# Patient Record
Sex: Female | Born: 1980 | Race: Black or African American | Hispanic: No | Smoking: Former smoker
Health system: Southern US, Community
[De-identification: ages and names within clinical notes are randomized; demographics above are authoritative.]

## PROBLEM LIST (undated history)

## (undated) DIAGNOSIS — Z87442 Personal history of urinary calculi: Secondary | ICD-10-CM

## (undated) HISTORY — PX: LITHOTRIPSY: SUR834

## (undated) HISTORY — PX: TONSILLECTOMY: SUR1361

## (undated) HISTORY — PX: CHOLECYSTECTOMY: SHX55

## (undated) HISTORY — PX: WISDOM TOOTH EXTRACTION: SHX21

## (undated) HISTORY — PX: OTHER SURGICAL HISTORY: SHX169

---

## 2003-01-16 ENCOUNTER — Encounter: Admission: RE | Admit: 2003-01-16 | Discharge: 2003-02-22 | Payer: Self-pay | Admitting: Unknown Physician Specialty

## 2003-03-28 ENCOUNTER — Encounter: Admission: RE | Admit: 2003-03-28 | Discharge: 2003-06-26 | Payer: Self-pay | Admitting: Orthopaedic Surgery

## 2006-09-01 ENCOUNTER — Other Ambulatory Visit: Admission: RE | Admit: 2006-09-01 | Discharge: 2006-09-01 | Payer: Self-pay | Admitting: Unknown Physician Specialty

## 2006-09-01 ENCOUNTER — Encounter (INDEPENDENT_AMBULATORY_CARE_PROVIDER_SITE_OTHER): Payer: Self-pay | Admitting: Specialist

## 2007-06-27 ENCOUNTER — Emergency Department (HOSPITAL_COMMUNITY): Admission: EM | Admit: 2007-06-27 | Discharge: 2007-06-27 | Payer: Self-pay | Admitting: Emergency Medicine

## 2008-06-29 ENCOUNTER — Emergency Department (HOSPITAL_COMMUNITY): Admission: EM | Admit: 2008-06-29 | Discharge: 2008-06-29 | Payer: Self-pay | Admitting: Emergency Medicine

## 2008-07-07 ENCOUNTER — Ambulatory Visit (HOSPITAL_COMMUNITY): Admission: RE | Admit: 2008-07-07 | Discharge: 2008-07-07 | Payer: Self-pay | Admitting: Urology

## 2008-07-10 ENCOUNTER — Emergency Department (HOSPITAL_COMMUNITY): Admission: EM | Admit: 2008-07-10 | Discharge: 2008-07-11 | Payer: Self-pay | Admitting: Emergency Medicine

## 2008-09-17 ENCOUNTER — Emergency Department (HOSPITAL_COMMUNITY): Admission: EM | Admit: 2008-09-17 | Discharge: 2008-09-17 | Payer: Self-pay | Admitting: Emergency Medicine

## 2009-03-20 ENCOUNTER — Emergency Department (HOSPITAL_COMMUNITY): Admission: EM | Admit: 2009-03-20 | Discharge: 2009-03-21 | Payer: Self-pay | Admitting: Pediatrics

## 2009-03-26 ENCOUNTER — Emergency Department (HOSPITAL_COMMUNITY): Admission: EM | Admit: 2009-03-26 | Discharge: 2009-03-26 | Payer: Self-pay | Admitting: Emergency Medicine

## 2010-05-26 ENCOUNTER — Emergency Department (HOSPITAL_COMMUNITY): Admission: EM | Admit: 2010-05-26 | Discharge: 2010-05-26 | Payer: Self-pay | Admitting: Emergency Medicine

## 2010-05-30 ENCOUNTER — Ambulatory Visit: Payer: Self-pay | Admitting: Otolaryngology

## 2010-06-09 ENCOUNTER — Emergency Department (HOSPITAL_COMMUNITY): Admission: EM | Admit: 2010-06-09 | Discharge: 2010-06-09 | Payer: Self-pay | Admitting: Emergency Medicine

## 2010-06-26 IMAGING — CT CT HEAD W/O CM
1 series · 16 of 30 positions shown, 20 images · non-contrast
Comparison: None

CLINICAL DATA: Severe headache for 2 days

CT HEAD WITHOUT CONTRAST
TECHNIQUE: Contiguous axial images were obtained from the base of
the skull through the vertex without contrast.

[Series 3: headseq 4.8 h37s · axial · 0.43mm/px · z∈[+92,+221]mm · 16 of 30 slices shown, 20 images]
[im 2/30  brain]
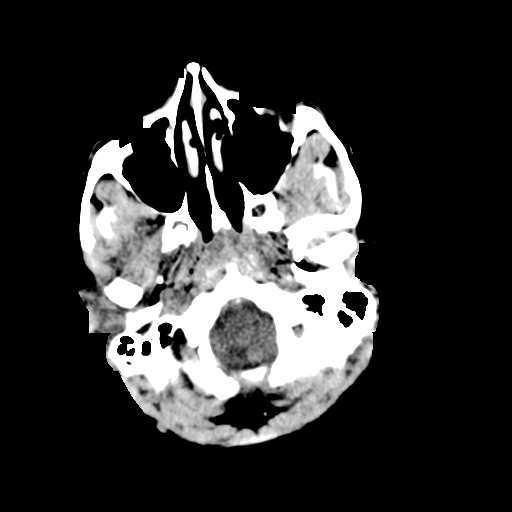
[im 2/30  bone]
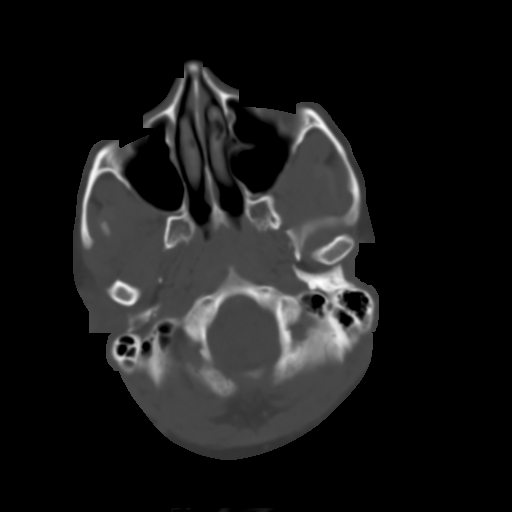
[im 4/30  brain]
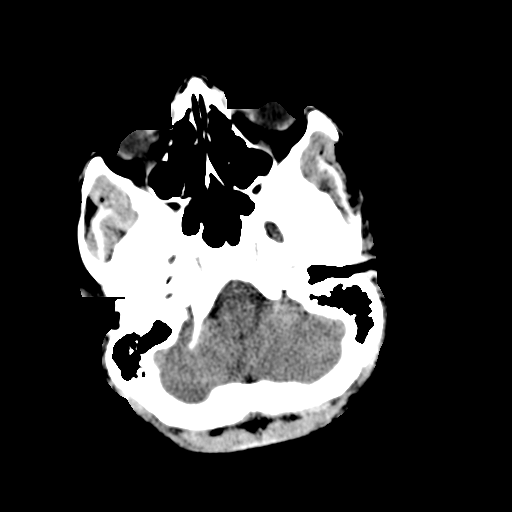
[im 6/30  brain]
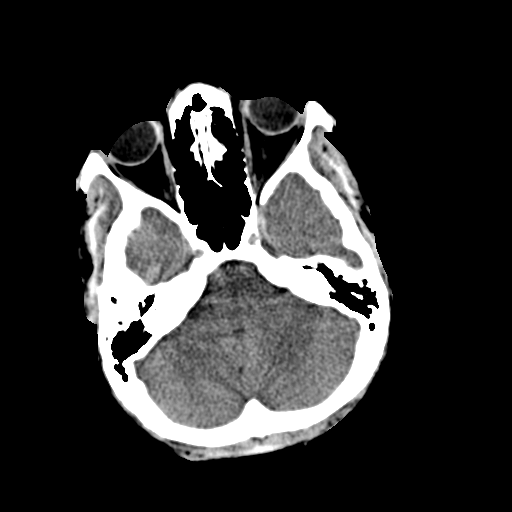
[im 8/30  brain]
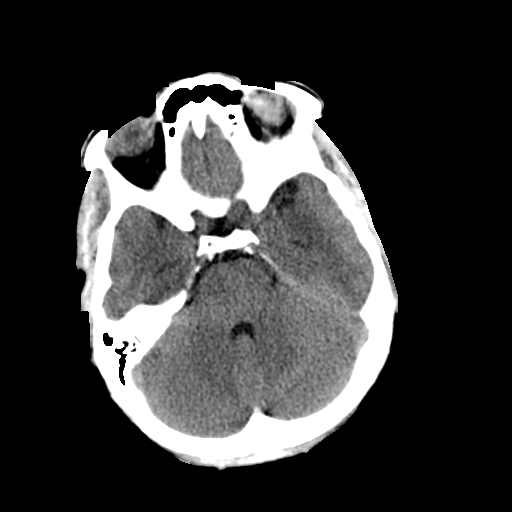
[im 9/30  brain]
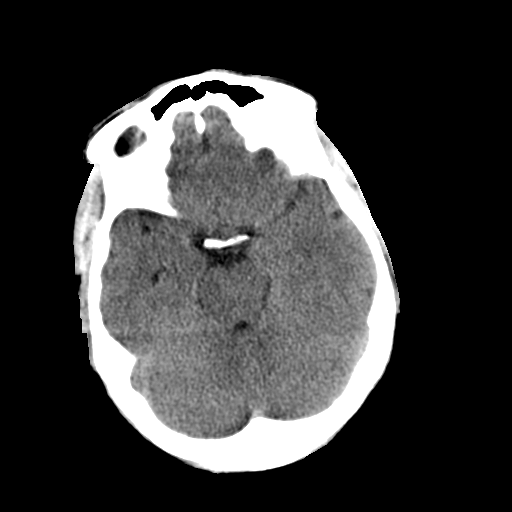
[im 9/30  bone]
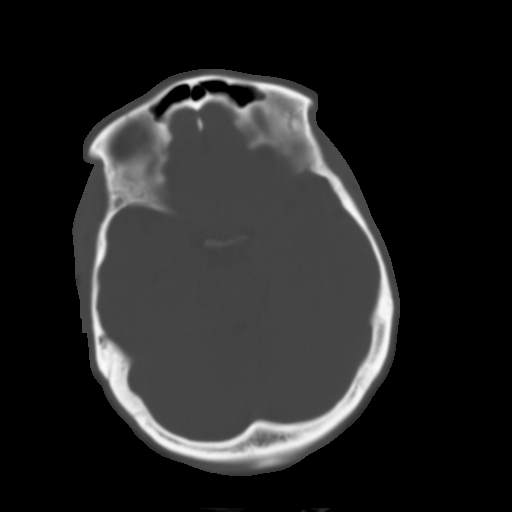
[im 11/30  brain]
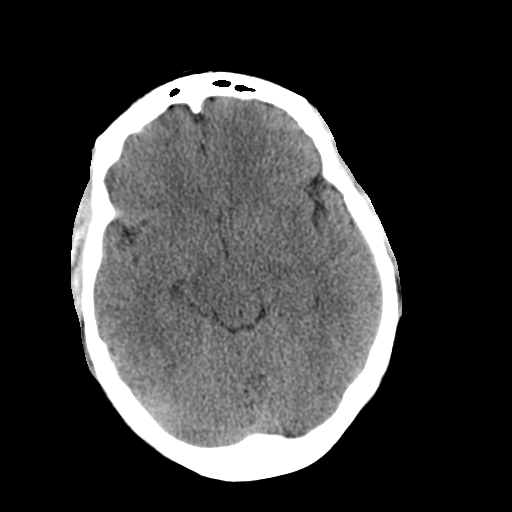
[im 13/30  brain]
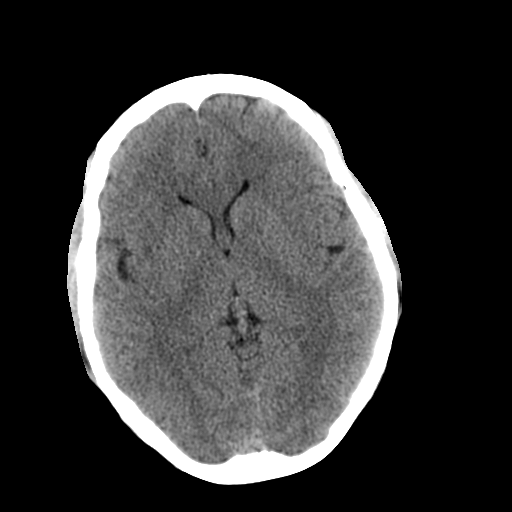
[im 15/30  brain]
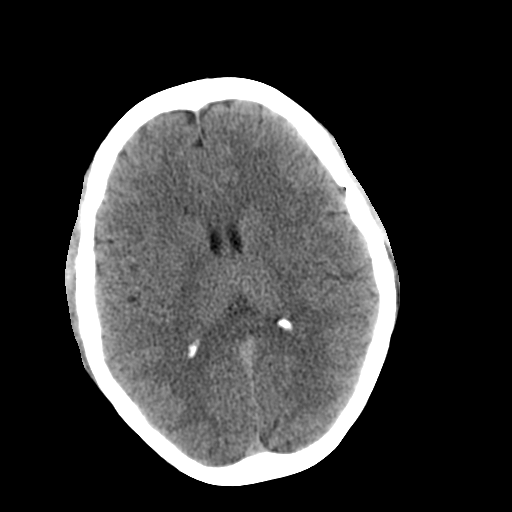
[im 16/30  brain]
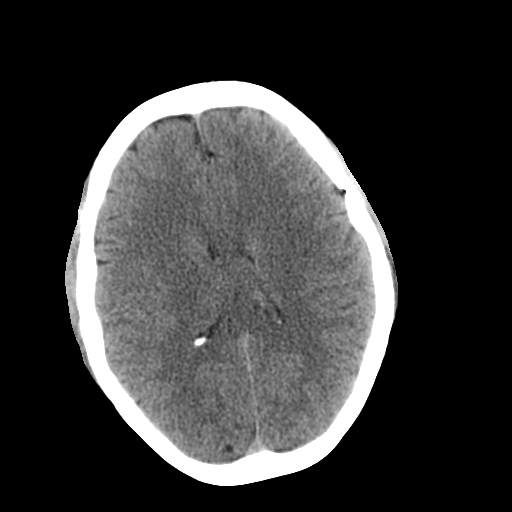
[im 16/30  bone]
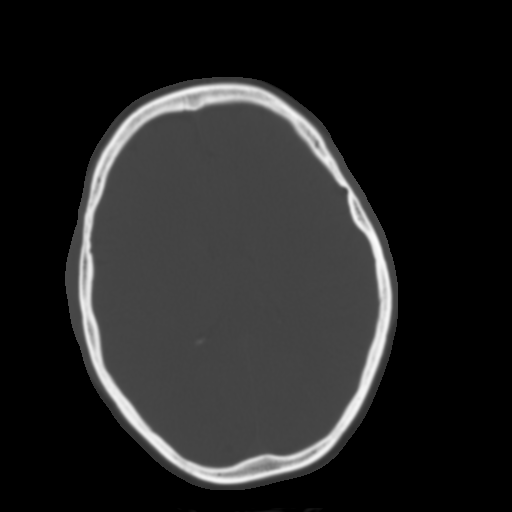
[im 18/30  brain]
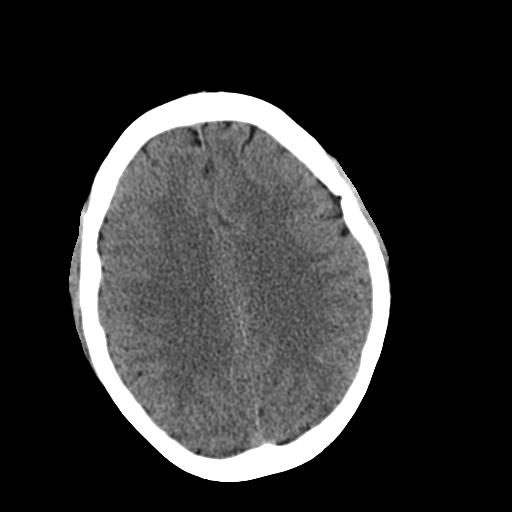
[im 20/30  brain]
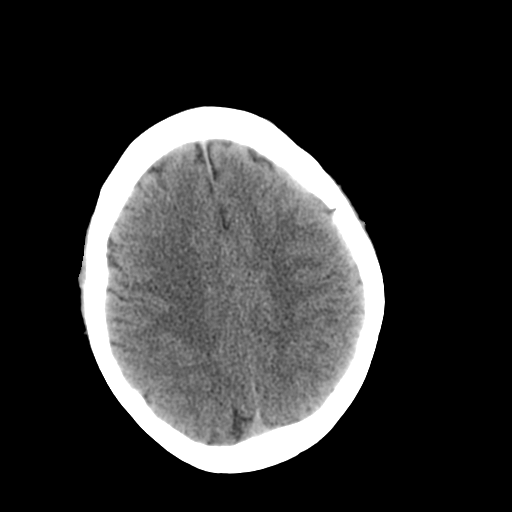
[im 22/30  brain]
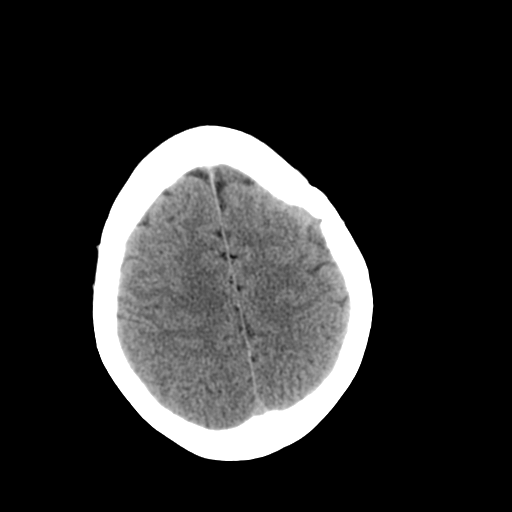
[im 23/30  brain]
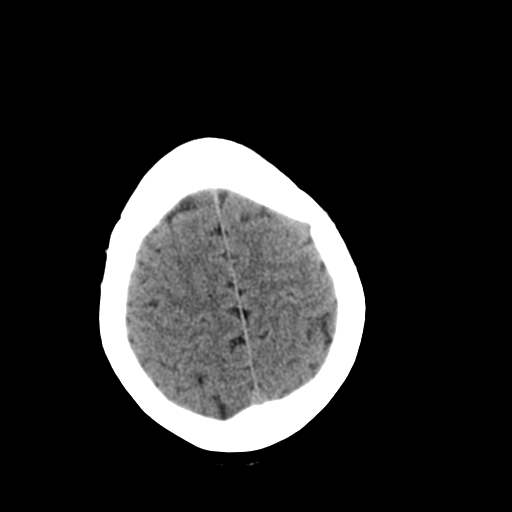
[im 23/30  bone]
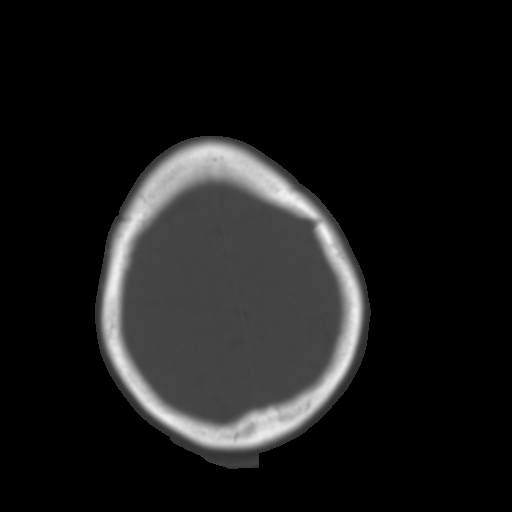
[im 25/30  brain]
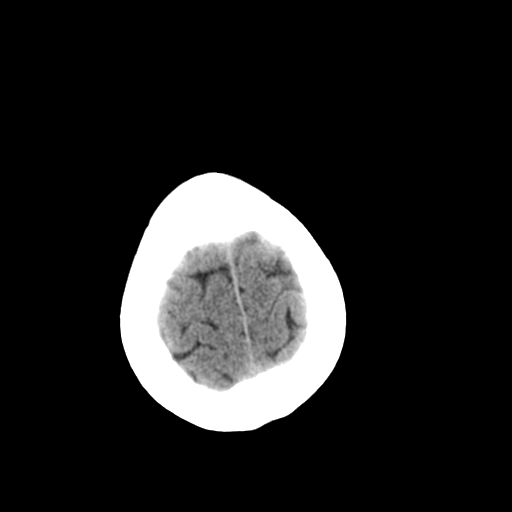
[im 27/30  brain]
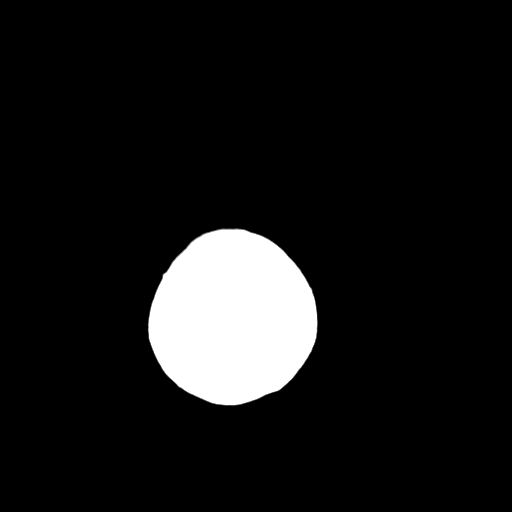
[im 29/30  brain]
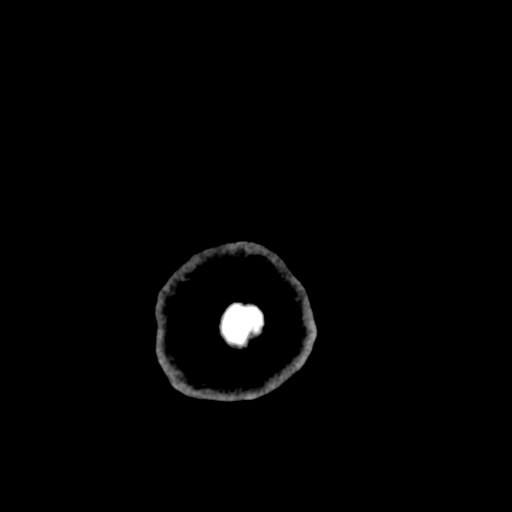

[16 of 30 positions shown; findings below may reference images not displayed]

FINDINGS: No extra-axial fluid collections or intraparenchymal
hemorrhage.  No midline shift.  No mass lesion.  No CT evidence of
acute infarction.  No hydrocephalous.  Basilar cisterns are patent.

Paranasal sinuses and mastoid air cells are clear.  Orbits appear
normal.
IMPRESSION: 1.  No acute intracranial process.

## 2010-06-27 ENCOUNTER — Ambulatory Visit: Payer: Self-pay | Admitting: Otolaryngology

## 2010-07-25 ENCOUNTER — Ambulatory Visit: Payer: Self-pay | Admitting: Otolaryngology

## 2010-08-22 ENCOUNTER — Ambulatory Visit (HOSPITAL_COMMUNITY)
Admission: RE | Admit: 2010-08-22 | Discharge: 2010-08-22 | Payer: Self-pay | Source: Home / Self Care | Admitting: Otolaryngology

## 2010-08-22 ENCOUNTER — Ambulatory Visit: Payer: Self-pay | Admitting: Otolaryngology

## 2010-09-05 ENCOUNTER — Ambulatory Visit: Payer: Self-pay | Admitting: Otolaryngology

## 2010-10-05 ENCOUNTER — Inpatient Hospital Stay (HOSPITAL_COMMUNITY): Admission: EM | Admit: 2010-10-05 | Discharge: 2010-10-06 | Payer: Self-pay | Source: Home / Self Care

## 2010-10-09 LAB — PREGNANCY, URINE: Preg Test, Ur: NEGATIVE

## 2010-10-10 ENCOUNTER — Ambulatory Visit (HOSPITAL_COMMUNITY)
Admission: RE | Admit: 2010-10-10 | Discharge: 2010-10-10 | Payer: Self-pay | Source: Home / Self Care | Attending: Urology | Admitting: Urology

## 2010-10-31 LAB — SURGICAL PCR SCREEN
MRSA, PCR: NEGATIVE
Staphylococcus aureus: NEGATIVE

## 2010-10-31 NOTE — Op Note (Addendum)
  Kristi Edwards, Kristi Edwards          ACCOUNT NO.:  [id number]  MEDICAL RECORD NO.:  [id number]          PATIENT TYPE:  AMB  LOCATION:  DAY                           FACILITY:  [facility]  PHYSICIAN:  Ky Barban, M.D.DATE OF BIRTH:  [birth year]  DATE OF PROCEDURE:  10/10/2010 DATE OF DISCHARGE:                              OPERATIVE REPORT   PREOPERATIVE DIAGNOSIS:  Right ureteral calculus.  POSTOPERATIVE DIAGNOSIS:  Right ureteral calculus.  PROCEDURES: 1. Cystoscopy. 2. Right retrograde pyelogram. 3. Ureteroscopic stone basket extraction. 4. Holmium laser lithotripsy. 5. Insertion of double-J stent, size 5-French, 24 cm, no string     attached.  ANESTHESIA:  General.  The patient, under general anesthesia, in lithotomy position, after usual prep and drape, urethra was dilated to get the cystoscope and the bladder looks normal. Right ureteral orifice catheterized with a wedge catheter.  Hypaque injected under fluoroscopic control.  Ureter above the ureterovesical junction is dilated that is where the filling defect was seen.  Now, a guidewire was passed up into the renal pelvis and cystoscope was removed over the guidewire.  A 15 balloon was introduced and intramural ureter was dilated.  Once the ureter was dilated, the balloon was removed and alongside the guide wire, introduced short rigid ureteroscope, went to the level of the stone, stone was broken with laser, then pieces were retrieved with the help of a basket.  At the end, the ureter was inspected, looks fine.  All the pieces were out.  A cystoscope was reintroduced over the guidewire.  A double-J stent was passed up into the renal pelvis.  Nice loop in the bladder and the renal pelvis was obtained after removing the guidewire.  All the instruments were removed.  The patient left the operating room in satisfactory condition.     Ky Barban, M.D.     MIJ/MEDQ  D:  10/10/2010  T:  10/10/2010  Job:   [id number]  Electronically Signed by Alleen Borne M.D. on 10/31/2010 04:53:06 PM

## 2010-12-16 LAB — CBC
HCT: 34.3 % — ABNORMAL LOW (ref 36.0–46.0)
Hemoglobin: 12.1 g/dL (ref 12.0–15.0)
MCH: 32.4 pg (ref 26.0–34.0)
MCHC: 35.3 g/dL (ref 30.0–36.0)
MCV: 91.7 fL (ref 78.0–100.0)
Platelets: 268 10*3/uL (ref 150–400)
RBC: 3.74 MIL/uL — ABNORMAL LOW (ref 3.87–5.11)
RDW: 12.7 % (ref 11.5–15.5)
WBC: 11.8 10*3/uL — ABNORMAL HIGH (ref 4.0–10.5)

## 2010-12-16 LAB — URINALYSIS, ROUTINE W REFLEX MICROSCOPIC
Glucose, UA: NEGATIVE mg/dL
Ketones, ur: NEGATIVE mg/dL
Leukocytes, UA: NEGATIVE
Nitrite: NEGATIVE
Protein, ur: 100 mg/dL — AB
Specific Gravity, Urine: >1.03 — ABNORMAL HIGH (ref 1.005–1.030)
Urobilinogen, UA: 0.2 mg/dL (ref 0.0–1.0)
pH: 6 (ref 5.0–8.0)

## 2010-12-16 LAB — DIFFERENTIAL
Basophils Absolute: 0 10*3/uL (ref 0.0–0.1)
Basophils Relative: 0 % (ref 0–1)
Eosinophils Absolute: 0 10*3/uL (ref 0.0–0.7)
Eosinophils Relative: 0 % (ref 0–5)
Lymphocytes Relative: 22 % (ref 12–46)
Lymphs Abs: 2.6 10*3/uL (ref 0.7–4.0)
Monocytes Absolute: 0.5 10*3/uL (ref 0.1–1.0)
Monocytes Relative: 4 % (ref 3–12)
Neutro Abs: 8.7 10*3/uL — ABNORMAL HIGH (ref 1.7–7.7)
Neutrophils Relative %: 73 % (ref 43–77)

## 2010-12-16 LAB — PREGNANCY, URINE: Preg Test, Ur: NEGATIVE

## 2010-12-16 LAB — URINE MICROSCOPIC-ADD ON

## 2010-12-16 LAB — BASIC METABOLIC PANEL
BUN: 14 mg/dL (ref 6–23)
CO2: 24 mEq/L (ref 19–32)
Calcium: 8.9 mg/dL (ref 8.4–10.5)
Chloride: 108 mEq/L (ref 96–112)
Creatinine, Ser: 0.87 mg/dL (ref 0.4–1.2)
GFR calc Af Amer: >60 mL/min (ref >60)
GFR calc non Af Amer: >60 mL/min (ref >60)
Glucose, Bld: 111 mg/dL — ABNORMAL HIGH (ref 70–99)
Potassium: 4.2 mEq/L (ref 3.5–5.1)
Sodium: 139 mEq/L (ref 135–145)

## 2010-12-17 LAB — URINALYSIS, ROUTINE W REFLEX MICROSCOPIC
Bilirubin Urine: NEGATIVE
Glucose, UA: NEGATIVE mg/dL
Ketones, ur: NEGATIVE mg/dL
Leukocytes, UA: NEGATIVE
Nitrite: NEGATIVE
Protein, ur: NEGATIVE mg/dL
Specific Gravity, Urine: 1.03 (ref 1.005–1.030)
Urobilinogen, UA: 0.2 mg/dL (ref 0.0–1.0)
pH: 5.5 (ref 5.0–8.0)

## 2010-12-17 LAB — BASIC METABOLIC PANEL
BUN: 6 mg/dL (ref 6–23)
CO2: 27 mEq/L (ref 19–32)
Calcium: 9.4 mg/dL (ref 8.4–10.5)
Chloride: 100 mEq/L (ref 96–112)
Creatinine, Ser: 0.6 mg/dL (ref 0.4–1.2)
GFR calc Af Amer: >60 mL/min (ref >60)
GFR calc non Af Amer: >60 mL/min (ref >60)
Glucose, Bld: 79 mg/dL (ref 70–99)
Potassium: 3.5 mEq/L (ref 3.5–5.1)
Sodium: 134 mEq/L — ABNORMAL LOW (ref 135–145)

## 2010-12-17 LAB — HEMOGLOBIN AND HEMATOCRIT, BLOOD
HCT: 38.2 % (ref 36.0–46.0)
Hemoglobin: 13.1 g/dL (ref 12.0–15.0)

## 2010-12-17 LAB — SURGICAL PCR SCREEN
MRSA, PCR: NEGATIVE
Staphylococcus aureus: NEGATIVE

## 2010-12-17 LAB — URINE MICROSCOPIC-ADD ON

## 2010-12-17 LAB — PREGNANCY, URINE: Preg Test, Ur: NEGATIVE

## 2010-12-19 LAB — COMPREHENSIVE METABOLIC PANEL
AST: 21 U/L (ref 0–37)
Albumin: 4 g/dL (ref 3.5–5.2)
Calcium: 8.8 mg/dL (ref 8.4–10.5)
Chloride: 103 mEq/L (ref 96–112)
Creatinine, Ser: 0.58 mg/dL (ref 0.4–1.2)
GFR calc Af Amer: >60 mL/min (ref >60)
Total Bilirubin: 0.5 mg/dL (ref 0.3–1.2)
Total Protein: 7.5 g/dL (ref 6.0–8.3)

## 2010-12-19 LAB — CBC
MCH: 32.2 pg (ref 26.0–34.0)
Platelets: 289 10*3/uL (ref 150–400)
RBC: 4.31 MIL/uL (ref 3.87–5.11)
RDW: 13.4 % (ref 11.5–15.5)
WBC: 11.4 10*3/uL — ABNORMAL HIGH (ref 4.0–10.5)

## 2010-12-19 LAB — URINALYSIS, ROUTINE W REFLEX MICROSCOPIC
Bilirubin Urine: NEGATIVE
Nitrite: NEGATIVE
Specific Gravity, Urine: 1.025 (ref 1.005–1.030)
pH: 6 (ref 5.0–8.0)

## 2010-12-19 LAB — DIFFERENTIAL
Eosinophils Relative: 1 % (ref 0–5)
Lymphocytes Relative: 27 % (ref 12–46)
Lymphs Abs: 3 10*3/uL (ref 0.7–4.0)
Monocytes Absolute: 0.5 10*3/uL (ref 0.1–1.0)
Monocytes Relative: 5 % (ref 3–12)

## 2010-12-19 LAB — PREGNANCY, URINE: Preg Test, Ur: NEGATIVE

## 2010-12-19 LAB — URINE MICROSCOPIC-ADD ON

## 2010-12-20 LAB — BASIC METABOLIC PANEL
BUN: 8 mg/dL (ref 6–23)
CO2: 25 mEq/L (ref 19–32)
Chloride: 107 mEq/L (ref 96–112)
Creatinine, Ser: 0.66 mg/dL (ref 0.4–1.2)
Glucose, Bld: 87 mg/dL (ref 70–99)
Potassium: 3.5 mEq/L (ref 3.5–5.1)

## 2010-12-20 LAB — CBC
HCT: 38.8 % (ref 36.0–46.0)
MCH: 32.2 pg (ref 26.0–34.0)
MCV: 94.1 fL (ref 78.0–100.0)
Platelets: 268 10*3/uL (ref 150–400)
RDW: 13.1 % (ref 11.5–15.5)

## 2010-12-20 LAB — DIFFERENTIAL
Basophils Absolute: 0 10*3/uL (ref 0.0–0.1)
Eosinophils Absolute: 0.1 10*3/uL (ref 0.0–0.7)
Eosinophils Relative: 1 % (ref 0–5)
Monocytes Absolute: 0.5 10*3/uL (ref 0.1–1.0)

## 2010-12-20 LAB — URINALYSIS, ROUTINE W REFLEX MICROSCOPIC
Bilirubin Urine: NEGATIVE
Ketones, ur: NEGATIVE mg/dL
Nitrite: NEGATIVE
Specific Gravity, Urine: 1.025 (ref 1.005–1.030)
Urobilinogen, UA: 0.2 mg/dL (ref 0.0–1.0)
pH: 6 (ref 5.0–8.0)

## 2010-12-20 LAB — URINE MICROSCOPIC-ADD ON

## 2010-12-20 LAB — POCT PREGNANCY, URINE: Preg Test, Ur: NEGATIVE

## 2011-01-13 LAB — COMPREHENSIVE METABOLIC PANEL
ALT: 12 U/L (ref 0–35)
Albumin: 4.3 g/dL (ref 3.5–5.2)
Calcium: 9.8 mg/dL (ref 8.4–10.5)
Glucose, Bld: 106 mg/dL — ABNORMAL HIGH (ref 70–99)
Sodium: 137 mEq/L (ref 135–145)
Total Protein: 7.4 g/dL (ref 6.0–8.3)

## 2011-01-13 LAB — URINALYSIS, ROUTINE W REFLEX MICROSCOPIC
Glucose, UA: NEGATIVE mg/dL
Glucose, UA: >1000 mg/dL — AB
Ketones, ur: NEGATIVE mg/dL
Leukocytes, UA: NEGATIVE
Leukocytes, UA: NEGATIVE
Nitrite: NEGATIVE
Specific Gravity, Urine: 1.02 (ref 1.005–1.030)
pH: 5.5 (ref 5.0–8.0)
pH: 6 (ref 5.0–8.0)

## 2011-01-13 LAB — DIC (DISSEMINATED INTRAVASCULAR COAGULATION)PANEL
D-Dimer, Quant: 0.89 ug/mL-FEU — ABNORMAL HIGH (ref 0.00–0.48)
INR: 1 (ref 0.00–1.49)
Platelets: 295 10*3/uL (ref 150–400)
Prothrombin Time: 13.6 seconds (ref 11.6–15.2)

## 2011-01-13 LAB — GLUCOSE, CAPILLARY: Glucose-Capillary: 49 mg/dL — ABNORMAL LOW (ref 70–99)

## 2011-01-13 LAB — URINE MICROSCOPIC-ADD ON

## 2011-01-13 LAB — DIFFERENTIAL
Eosinophils Absolute: 0.1 10*3/uL (ref 0.0–0.7)
Lymphs Abs: 3.9 10*3/uL (ref 0.7–4.0)
Monocytes Absolute: 0.6 10*3/uL (ref 0.1–1.0)
Monocytes Relative: 6 % (ref 3–12)
Neutro Abs: 5.3 10*3/uL (ref 1.7–7.7)
Neutrophils Relative %: 53 % (ref 43–77)

## 2011-01-13 LAB — BASIC METABOLIC PANEL
Chloride: 105 mEq/L (ref 96–112)
GFR calc Af Amer: >60 mL/min (ref >60)
GFR calc non Af Amer: >60 mL/min (ref >60)
Potassium: 4 mEq/L (ref 3.5–5.1)
Sodium: 137 mEq/L (ref 135–145)

## 2011-01-13 LAB — CBC
Hemoglobin: 13.7 g/dL (ref 12.0–15.0)
MCHC: 35.6 g/dL (ref 30.0–36.0)
Platelets: 296 10*3/uL (ref 150–400)
RDW: 12.7 % (ref 11.5–15.5)

## 2011-01-13 LAB — POCT CARDIAC MARKERS

## 2011-02-18 NOTE — Op Note (Signed)
Kristi Edwards, Kristi Edwards          ACCOUNT NO.:  [id number]   MEDICAL RECORD NO.:  [id number]          PATIENT TYPE:  AMB   LOCATION:  DAY                           FACILITY:  [facility]   PHYSICIAN:  Ky Barban, M.D.DATE OF BIRTH:  1981/09/27   DATE OF PROCEDURE:  DATE OF DISCHARGE:                               OPERATIVE REPORT   PREOPERATIVE DIAGNOSIS:  Right ureteral calculus.   POSTOPERATIVE DIAGNOSIS:  Right ureteral calculus.   PROCEDURES:  Cystoscopy; right retrograde pyelogram; ureteroscopic stone  basket extraction with holmium laser lithotripsy; and insertion of  double-J stent, size 5-French 24 cm and no string attached.   ANESTHESIA:  General.   The patient had general anesthesia after usual lithotomy position, usual  prep and drape #25 cystoscope introduced into the bladder.  Bladder  looks normal.  Right ureteral orifice catheterized with a wedge  catheter.  Hypaque was injected under fluoro control.  The stone was  seen in the ureterovesical junction, ureter above that is markedly  dilated.  A guidewire was passed up into the renal pelvis and cystoscope  was removed.  Over the guidewire balloon, dilator #18 was introduced, it  is positioned and in the intramural ureter, it was dilated.  Now the  balloon is removed leaving the guidewire in place.  I introduced short  rigid ureteroscope alongside the guidewire.  The stone was visualized.  It was stabilized with the help of a stone basket.  Then using a laser  fiber, 450 fiber, the stone was completely pulverized.  The energy used  was 0.8 joules at 20 pulse rate.  Power was 12 watts.  Total energy  used, 0.23 kilojoules.  Stone is removed and with the help of the basket  at the end, the ureteroscope was reintroduced.  I do not see any problem  with the ureter.  Stone was completely removed.  Ureteroscope was  removed.  Cystoscope was introduced over the guidewire and a double-J  stent 5-French 24 cm without the  string was passed up over the  guidewire, positioned within the renal pelvis and the bladder with the  help of the fluoroscopy.  The guidewire was removed.  The double-J stent  was in position.  All other instruments were removed.  The patient left  the operating room in satisfactory condition.      Ky Barban, M.D.  Electronically Signed     MIJ/MEDQ  D:  07/07/2008  T:  07/07/2008  Job:  [id number]

## 2011-02-18 NOTE — Consult Note (Signed)
NAMEKARESSA, ONORATO          ACCOUNT NO.:  [id number]   MEDICAL RECORD NO.:  [id number]          PATIENT TYPE:  AMB   LOCATION:  DAY                           FACILITY:  [facility]   PHYSICIAN:  Ky Barban, M.D.DATE OF BIRTH:  [birth year]   DATE OF CONSULTATION:  DATE OF DISCHARGE:                                 CONSULTATION   CHIEF COMPLAINT:  Recurrent right renal colic.   HISTORY:  A 30 year old female who came to see me on September 28 with 2-  week history of right renal colic.  She has gone to the emergency room  in [facility] on September 24 and a CT scan shows there is a 6 x 8 mm  stone in the distal right ureter, 3 cm from the ureterovesical junction  causing some obstruction and the patient is having these recurrent  episodes for the last 2 weeks, so I have recommended that we go ahead  and do a stone basket.  Procedure, its limitations, complications,  especially ureteral perforation leading to open surgery were discussed.  She understands, wanting to go ahead and proceed.  She is coming as  outpatient, in the morning we will do the procedure then she will go  back home.  I may have to leave a double-J stent.   PAST HISTORY:  No history of kidney stone, never had surgery.  No  history of diabetes or hypertension.   FAMILY HISTORY:  Negative.   PERSONAL HISTORY:  Negative.  Does not smoke or drink.   ALLERGIES:  SULFA drugs.   REVIEW OF SYSTEMS:  Unremarkable.   PHYSICAL EXAMINATION:  VITAL SIGNS:  Blood pressure 120/84, temperature  98.6.  Central nervous system is negative.  HEAD, NECK, EYE, AND ENT:  Negative.  CHEST:  Symmetrical.  ABDOMEN:  Soft, flat.  Liver, spleen, kidneys are not palpable.  1+  right CVA tenderness.  PELVIC:  No adnexal mass or tenderness.   IMPRESSION:  Right ureteral calculus.   PLAN:  Cystoscopy, right retrograde pyelogram, ureteroscopic stone  basket extraction with holmium laser lithotripsy, possible insertion of  double-J stent.      Ky Barban, M.D.  Electronically Signed     MIJ/MEDQ  D:  07/06/2008  T:  07/07/2008  Job:  [id number]

## 2011-03-19 NOTE — H&P (Signed)
  NAME:  Kristi Edwards, SWENOR NO.:  [id number]  MEDICAL RECORD NO.:  [id number]  LOCATION:                                 FACILITY:  PHYSICIAN:  Ky Barban, M.D.    DATE OF BIRTH:  DATE OF ADMISSION:  10/10/2010 DATE OF DISCHARGE:  LH                             HISTORY & PHYSICAL   CHIEF COMPLAINT:  Recurrent right renal colic.  HISTORY:  A 30 year old female was recently in hospital with right renal colic.  She had 6 mm stone in the distal right ureter.  The pain subsided.  I sent her home, so I think that she will pass the stone, but she continued to have the pain, so she was brought in to do a stone basket.  She is familiar with the procedure of stone basket because in 2009, she had similar problem and I had done a stone basket.  I discussed again stone basket procedure and possible complications and use of double-J stent she understood and want me to go ahead and proceed.  No fever, chills, or gross hematuria.  She had a past medical history in 2009 she had a stone and underwent stone basket no other significant medical problem like diabetes or hypertension.  PERSONAL HISTORY/SOCIAL HISTORY:  She does not smoke or drink.  She is separated and has 4 children.  REVIEW OF SYSTEMS:  Unremarkable.  PHYSICAL EXAMINATION:  GENERAL:  On examination, well-nourished, well- developed female not in acute distress, fully conscious, alert, oriented. VITAL SIGNS:  Blood pressure 120/80, temperature normal. CENTRAL NERVOUS SYSTEM:  No gross neurological deficit. HEAD/NECK/EYE/ENT:  Negative. CHEST:  Symmetrical. HEART:  Regular sinus rhythm.  No murmur. ABDOMEN:  Soft, flat.  Liver, spleen, kidneys are not palpable.  1+ right CVA tenderness. PELVIC:  Unremarkable.  IMPRESSION:  Right distal ureteral calculus.  PLAN:  Cystoscopy, right retrograde pyelogram, ureteroscopic stone basket extraction, use of double-J stent.     Ky Barban,  M.D.     MIJ/MEDQ  D:  03/12/2011  T:  03/13/2011  Job:  [id number]  Electronically Signed by Alleen Borne M.D. on 03/19/2011 01:23:09 PM

## 2011-07-07 LAB — URINALYSIS, ROUTINE W REFLEX MICROSCOPIC
Bilirubin Urine: NEGATIVE
Bilirubin Urine: NEGATIVE
Glucose, UA: NEGATIVE
Glucose, UA: NEGATIVE
Ketones, ur: NEGATIVE
Leukocytes, UA: NEGATIVE
Nitrite: POSITIVE — AB
Protein, ur: NEGATIVE
Urobilinogen, UA: 0.2
Urobilinogen, UA: 1
pH: 5
pH: 5

## 2011-07-07 LAB — BASIC METABOLIC PANEL
BUN: 15
CO2: 27
CO2: 28
Chloride: 106
GFR calc Af Amer: >60
GFR calc non Af Amer: >60
Glucose, Bld: 96
Glucose, Bld: 130 — ABNORMAL HIGH
Potassium: 3.7
Potassium: 3.3 — ABNORMAL LOW
Sodium: 138
Sodium: 137

## 2011-07-07 LAB — DIFFERENTIAL
Basophils Absolute: 0.1
Eosinophils Absolute: 0.3
Eosinophils Relative: 3
Lymphocytes Relative: 30
Lymphocytes Relative: 35
Lymphs Abs: 2.5
Monocytes Absolute: 0.6
Monocytes Relative: 6
Neutro Abs: 5.1
Neutrophils Relative %: 62

## 2011-07-07 LAB — URINE CULTURE: Colony Count: 30000

## 2011-07-07 LAB — CBC
HCT: 38.1
HCT: 35.5 — ABNORMAL LOW
Hemoglobin: 13.4
Hemoglobin: 12.2
MCHC: 35.1
MCHC: 34.4
MCV: 94.4
MCV: 94.1
Platelets: 293
Platelets: 309
RBC: 4.01
RBC: 4.07
RDW: 12.7
RDW: 12.8
WBC: 8.4

## 2011-07-07 LAB — COMPREHENSIVE METABOLIC PANEL
BUN: 9
Calcium: 9.2
Creatinine, Ser: 0.6
Glucose, Bld: 95
Sodium: 137
Total Protein: 7.5

## 2011-07-07 LAB — URINE MICROSCOPIC-ADD ON

## 2011-07-11 LAB — DIFFERENTIAL
Basophils Relative: 1 % (ref 0–1)
Eosinophils Absolute: 0.2 10*3/uL (ref 0.0–0.7)
Neutro Abs: 4.8 10*3/uL (ref 1.7–7.7)
Neutrophils Relative %: 53 % (ref 43–77)

## 2011-07-11 LAB — CBC
HCT: 36.7 % (ref 36.0–46.0)
Hemoglobin: 12.6 g/dL (ref 12.0–15.0)
MCHC: 34.4 g/dL (ref 30.0–36.0)
RBC: 3.94 MIL/uL (ref 3.87–5.11)

## 2011-07-11 LAB — COMPREHENSIVE METABOLIC PANEL
ALT: 14 U/L (ref 0–35)
Alkaline Phosphatase: 39 U/L (ref 39–117)
BUN: 11 mg/dL (ref 6–23)
CO2: 27 mEq/L (ref 19–32)
Calcium: 9.2 mg/dL (ref 8.4–10.5)
GFR calc non Af Amer: >60 mL/min (ref >60)
Glucose, Bld: 86 mg/dL (ref 70–99)
Potassium: 3.5 mEq/L (ref 3.5–5.1)
Sodium: 137 mEq/L (ref 135–145)

## 2011-07-11 LAB — URINALYSIS, ROUTINE W REFLEX MICROSCOPIC
Ketones, ur: NEGATIVE mg/dL
Nitrite: NEGATIVE
Protein, ur: NEGATIVE mg/dL
Urobilinogen, UA: 0.2 mg/dL (ref 0.0–1.0)

## 2011-07-11 LAB — PREGNANCY, URINE: Preg Test, Ur: NEGATIVE

## 2011-07-17 LAB — RAPID STREP SCREEN (MED CTR MEBANE ONLY): Streptococcus, Group A Screen (Direct): NEGATIVE

## 2011-07-17 LAB — STREP A DNA PROBE: Group A Strep Probe: NEGATIVE

## 2011-11-15 ENCOUNTER — Encounter (HOSPITAL_COMMUNITY): Payer: Self-pay | Admitting: Emergency Medicine

## 2011-11-15 ENCOUNTER — Emergency Department (HOSPITAL_COMMUNITY)
Admission: EM | Admit: 2011-11-15 | Discharge: 2011-11-15 | Disposition: A | Discharge status: Home / Self Care | Payer: Medicaid Other | Attending: Emergency Medicine | Admitting: Emergency Medicine

## 2011-11-15 DIAGNOSIS — R5381 Other malaise: Secondary | ICD-10-CM | POA: Insufficient documentation

## 2011-11-15 DIAGNOSIS — R197 Diarrhea, unspecified: Secondary | ICD-10-CM | POA: Insufficient documentation

## 2011-11-15 DIAGNOSIS — K529 Noninfective gastroenteritis and colitis, unspecified: Secondary | ICD-10-CM

## 2011-11-15 DIAGNOSIS — R1110 Vomiting, unspecified: Secondary | ICD-10-CM | POA: Insufficient documentation

## 2011-11-15 DIAGNOSIS — R5383 Other fatigue: Secondary | ICD-10-CM | POA: Insufficient documentation

## 2011-11-15 DIAGNOSIS — J45909 Unspecified asthma, uncomplicated: Secondary | ICD-10-CM | POA: Insufficient documentation

## 2011-11-15 DIAGNOSIS — R509 Fever, unspecified: Secondary | ICD-10-CM | POA: Insufficient documentation

## 2011-11-15 DIAGNOSIS — K5289 Other specified noninfective gastroenteritis and colitis: Secondary | ICD-10-CM | POA: Insufficient documentation

## 2011-11-15 LAB — CBC
Hemoglobin: 13.5 g/dL (ref 12.0–15.0)
MCHC: 34.8 g/dL (ref 30.0–36.0)
RBC: 4.19 MIL/uL (ref 3.87–5.11)
WBC: 8.5 10*3/uL (ref 4.0–10.5)

## 2011-11-15 LAB — URINALYSIS, ROUTINE W REFLEX MICROSCOPIC
Leukocytes, UA: NEGATIVE
Nitrite: NEGATIVE
Protein, ur: NEGATIVE mg/dL
Urobilinogen, UA: 0.2 mg/dL (ref 0.0–1.0)

## 2011-11-15 LAB — COMPREHENSIVE METABOLIC PANEL
Albumin: 3.8 g/dL (ref 3.5–5.2)
BUN: 7 mg/dL (ref 6–23)
CO2: 25 mEq/L (ref 19–32)
Chloride: 103 mEq/L (ref 96–112)
Creatinine, Ser: 0.64 mg/dL (ref 0.50–1.10)
GFR calc non Af Amer: >90 mL/min (ref >90)
Total Bilirubin: 0.3 mg/dL (ref 0.3–1.2)

## 2011-11-15 LAB — DIFFERENTIAL
Basophils Relative: 0 % (ref 0–1)
Monocytes Absolute: 0.9 10*3/uL (ref 0.1–1.0)
Monocytes Relative: 10 % (ref 3–12)
Neutro Abs: 4 10*3/uL (ref 1.7–7.7)

## 2011-11-15 LAB — PREGNANCY, URINE: Preg Test, Ur: NEGATIVE

## 2011-11-15 MED ORDER — PROMETHAZINE HCL 25 MG PO TABS: 25.0000 mg | ORAL_TABLET | Freq: Four times a day (QID) | ORAL | Status: AC | PRN

## 2011-11-15 MED ORDER — SODIUM CHLORIDE 0.9 % IV SOLN
Freq: Once | INTRAVENOUS | Status: AC
  Administered 2011-11-15: 23:00:00 via INTRAVENOUS

## 2011-11-15 MED ORDER — ONDANSETRON HCL 4 MG/2ML IJ SOLN
4.0000 mg | Freq: Once | INTRAMUSCULAR | Status: AC
  Administered 2011-11-15: 4 mg via INTRAVENOUS
  Filled 2011-11-15: qty 2

## 2011-11-15 MED ORDER — KETOROLAC TROMETHAMINE 30 MG/ML IJ SOLN
30.0000 mg | Freq: Once | INTRAMUSCULAR | Status: AC
  Administered 2011-11-15: 30 mg via INTRAVENOUS
  Filled 2011-11-15: qty 1

## 2011-11-15 NOTE — ED Notes (Signed)
Rx given x1 D/c instructions reviewed w/ pt - pt denies any further questions or concerns at present.   

## 2011-11-15 NOTE — ED Notes (Signed)
Patient complaining of vomiting and diarrhea x 2 days. Started feeling weak today.

## 2011-11-15 NOTE — ED Provider Notes (Signed)
History   This chart was scribed for Geoffery Lyons, MD by Melba Coon. The patient was seen in room APA15/APA15 and the patient's care was started at 10:36PM.    CSN: [id number]  Arrival date & time 11/15/11  2213   First MD Initiated Contact with Patient 11/15/11 2232      Chief Complaint  Patient presents with  . Emesis  . Diarrhea  . Weakness    (Consider location/radiation/quality/duration/timing/severity/associated sxs/prior treatment) HPI Kristi Edwards is a 31 y.o. female who presents to the Emergency Department complaining of constant, moderate to severe emesis and diarrhea with an onset 2 days ago. Pt has seen minimal blood in her stool. Pt also has a fever and has taken Tylenol for it. Nobody is sick at home; nobody at work is sick either. Last episode of urination was 1 hr ago. No major surgeries; no chance of pregnancy.     Past Medical History  Diagnosis Date  . Asthma     History reviewed. No pertinent past surgical history.  History reviewed. No pertinent family history.  History  Substance Use Topics  . Smoking status: Never Smoker   . Smokeless tobacco: Not on file  . Alcohol Use: Yes     occasionally    OB History    Grav Para Term Preterm Abortions TAB SAB Ect Mult Living                  Review of Systems 10 Systems reviewed and are negative for acute change except as noted in the HPI.  Allergies  Dilaudid and Sulfa antibiotics  Home Medications  No current outpatient prescriptions on file.  BP 134/76  Pulse 109  Temp(Src) 97.2 F (36.2 C) (Oral)  Resp 16  Ht 5\' 6"  (1.676 m)  Wt 150 lb (68.04 kg)  BMI 24.21 kg/m2  SpO2 100%  Physical Exam  Nursing note and vitals reviewed. Constitutional: She is oriented to person, place, and time. She appears well-developed and well-nourished.       Awake, alert, nontoxic appearance.  HENT:  Head: Normocephalic and atraumatic.  Eyes: Conjunctivae and EOM are normal. Right eye  exhibits no discharge. Left eye exhibits no discharge. No scleral icterus.  Neck: Neck supple. No thyromegaly present.  Cardiovascular: Normal rate and regular rhythm.  Exam reveals no gallop and no friction rub.   No murmur heard. Pulmonary/Chest: Effort normal. No stridor. She has no wheezes. She has no rales. She exhibits no tenderness.  Abdominal: Soft. She exhibits no distension. There is no tenderness. There is no rebound.  Musculoskeletal: Normal range of motion. She exhibits no edema and no tenderness.       Baseline ROM, no obvious new focal weakness. No CVA tenderness.  Lymphadenopathy:    She has no cervical adenopathy.  Neurological: She is oriented to person, place, and time. Coordination normal.       Mental status and motor strength appears baseline for patient and situation.  Skin: No rash noted. No erythema.  Psychiatric: She has a normal mood and affect. Her behavior is normal.    ED Course  Procedures (including critical care time)  DIAGNOSTIC STUDIES: Oxygen Saturation is 100% on room air, normal by my interpretation.    COORDINATION OF CARE:  10:40PM - EDMD will give IV fluids with Zofran and check electrolytes. EDMD thinks it might be gastroenteritis  Labs Reviewed - No data to display No results found.   No diagnosis found.    MDM  Feels better with ivf, meds.  Seems like gastroenteritis.  Will discharge with phenergan, follow up prn.     I personally performed the services described in this documentation, which was scribed in my presence. The recorded information has been reviewed and considered.        Geoffery Lyons, MD 11/15/11 [phone]

## 2011-11-15 NOTE — ED Notes (Signed)
Pt admits to x2 days of n/v/d - denies any fever or sick contacts. Pt admits to weakness in her legs as well. Pt states she noted scant amt of blood in stool. Pt in no acute distress - guarding abd and grimacing. BS present in all four quadrants, abd non-tender to palpation.

## 2013-05-21 ENCOUNTER — Emergency Department (HOSPITAL_COMMUNITY)
Admission: EM | Admit: 2013-05-21 | Discharge: 2013-05-21 | Disposition: A | Discharge status: Home / Self Care | Payer: Medicaid Other | Attending: Emergency Medicine | Admitting: Emergency Medicine

## 2013-05-21 ENCOUNTER — Encounter (HOSPITAL_COMMUNITY): Payer: Self-pay | Admitting: Emergency Medicine

## 2013-05-21 DIAGNOSIS — N390 Urinary tract infection, site not specified: Secondary | ICD-10-CM | POA: Insufficient documentation

## 2013-05-21 DIAGNOSIS — B9689 Other specified bacterial agents as the cause of diseases classified elsewhere: Secondary | ICD-10-CM | POA: Insufficient documentation

## 2013-05-21 DIAGNOSIS — R42 Dizziness and giddiness: Secondary | ICD-10-CM | POA: Insufficient documentation

## 2013-05-21 DIAGNOSIS — J45909 Unspecified asthma, uncomplicated: Secondary | ICD-10-CM | POA: Insufficient documentation

## 2013-05-21 DIAGNOSIS — Z79899 Other long term (current) drug therapy: Secondary | ICD-10-CM | POA: Insufficient documentation

## 2013-05-21 DIAGNOSIS — A499 Bacterial infection, unspecified: Secondary | ICD-10-CM | POA: Insufficient documentation

## 2013-05-21 LAB — BASIC METABOLIC PANEL
BUN: 8 mg/dL (ref 6–23)
CO2: 27 mEq/L (ref 19–32)
Calcium: 9.4 mg/dL (ref 8.4–10.5)
Creatinine, Ser: 0.59 mg/dL (ref 0.50–1.10)
GFR calc Af Amer: >90 mL/min (ref >90)

## 2013-05-21 LAB — CBC WITH DIFFERENTIAL/PLATELET
Basophils Absolute: 0 10*3/uL (ref 0.0–0.1)
Basophils Relative: 0 % (ref 0–1)
Eosinophils Relative: 2 % (ref 0–5)
HCT: 38.1 % (ref 36.0–46.0)
MCHC: 34.1 g/dL (ref 30.0–36.0)
MCV: 93.4 fL (ref 78.0–100.0)
Monocytes Absolute: 0.4 10*3/uL (ref 0.1–1.0)
RDW: 12.3 % (ref 11.5–15.5)

## 2013-05-21 MED ORDER — LORAZEPAM 1 MG PO TABS: 1.0000 mg | ORAL_TABLET | Freq: Three times a day (TID) | ORAL | Status: DC | PRN

## 2013-05-21 MED ORDER — MECLIZINE HCL 12.5 MG PO TABS
25.0000 mg | ORAL_TABLET | Freq: Once | ORAL | Status: AC
  Administered 2013-05-21: 25 mg via ORAL
  Filled 2013-05-21: qty 2

## 2013-05-21 MED ORDER — MECLIZINE HCL 25 MG PO TABS: 25.0000 mg | ORAL_TABLET | Freq: Four times a day (QID) | ORAL | Status: DC | PRN

## 2013-05-21 NOTE — ED Notes (Signed)
Pt reports light headedness which started on Wednesday. Pt reports dizziness when lying down and standing.

## 2013-05-21 NOTE — ED Provider Notes (Signed)
Scribed for Donnetta Hutching, MD, the patient was seen in room APA12/APA12. This chart was scribed by Lewanda Rife, ED scribe. Patient's care was started at 1626  CSN: [id number]     Arrival date & time 05/21/13  1350 History     First MD Initiated Contact with Patient 05/21/13 1555     Chief Complaint  Patient presents with  . Dizziness   (Consider location/radiation/quality/duration/timing/severity/associated sxs/prior Treatment) The history is provided by the patient.  HPI Comments: Kristi Edwards is a 32 y.o. female who presents to the Emergency Department complaining of waxing and waning moderate positional dizziness onset 3 days. Denies other associated symptoms. Reports symptoms are aggravated with lying down and turning to the right and alleviated by nothing. Reports taking azithromycin for a urinary tract infection. Denies significant PMH.  Past Medical History  Diagnosis Date  . Asthma    Past Surgical History  Procedure Laterality Date  . Tonsillectomy     No family history on file. History  Substance Use Topics  . Smoking status: Never Smoker   . Smokeless tobacco: Not on file  . Alcohol Use: Yes     Comment: occasionally   OB History   Grav Para Term Preterm Abortions TAB SAB Ect Mult Living                 Review of Systems  Neurological: Positive for dizziness.   A complete 10 system review of systems was obtained and all systems are negative except as noted in the HPI and PMH.    Allergies  Dilaudid and Sulfa antibiotics  Home Medications   Current Outpatient Rx  Name  Route  Sig  Dispense  Refill  . azithromycin (ZITHROMAX Z-PAK) 250 MG tablet   Oral   Take 250-500 mg by mouth daily. Take two tablets on day 1 then take one tablet for 4 days          BP 124/68  Pulse 77  Temp(Src) 98.3 F (36.8 C) (Oral)  Resp 16  Ht 5\' 6"  (1.676 m)  Wt 155 lb (70.308 kg)  BMI 25.03 kg/m2  SpO2 100%  LMP 05/21/2013 Physical Exam  Nursing note and  vitals reviewed. Constitutional: She is oriented to person, place, and time. She appears well-developed and well-nourished. No distress.  HENT:  Head: Normocephalic and atraumatic.  Eyes: EOM are normal.  Neck: Neck supple. No tracheal deviation present.  Pulmonary/Chest: Effort normal. No respiratory distress.  Musculoskeletal: Normal range of motion.  Neurological: She is alert and oriented to person, place, and time.  Ambulates in room with normal gait and without ataxia  Skin: Skin is warm and dry.  Psychiatric: She has a normal mood and affect. Her behavior is normal.    ED Course   Procedures (including critical care time) Medications - No data to display  Labs Reviewed  BASIC METABOLIC PANEL - Abnormal; Notable for the following:    Glucose, Bld 126 (*)    All other components within normal limits  CBC WITH DIFFERENTIAL   No results found. No diagnosis found.  MDM  Patient is healthy with no chronic medical problems. No gross neurological deficits. History and physical consistent with uncomplicated vertigo.  Rx meclizine 25 mg and Ativan 1 mg I personally performed the services described in this documentation, which was scribed in my presence. The recorded information has been reviewed and is accurate.    Donnetta Hutching, MD 05/21/13 [phone]

## 2017-06-09 ENCOUNTER — Telehealth (HOSPITAL_COMMUNITY): Payer: Self-pay

## 2017-10-21 ENCOUNTER — Other Ambulatory Visit: Payer: Self-pay | Admitting: Orthopedic Surgery

## 2017-10-21 ENCOUNTER — Encounter (HOSPITAL_BASED_OUTPATIENT_CLINIC_OR_DEPARTMENT_OTHER): Payer: Self-pay | Admitting: *Deleted

## 2017-10-21 ENCOUNTER — Other Ambulatory Visit: Payer: Self-pay

## 2017-10-22 ENCOUNTER — Encounter (HOSPITAL_BASED_OUTPATIENT_CLINIC_OR_DEPARTMENT_OTHER): Payer: Self-pay | Admitting: Anesthesiology

## 2017-10-22 ENCOUNTER — Other Ambulatory Visit: Payer: Self-pay

## 2017-10-22 ENCOUNTER — Ambulatory Visit (HOSPITAL_BASED_OUTPATIENT_CLINIC_OR_DEPARTMENT_OTHER): Payer: Medicaid Other | Admitting: Anesthesiology

## 2017-10-22 ENCOUNTER — Ambulatory Visit (HOSPITAL_BASED_OUTPATIENT_CLINIC_OR_DEPARTMENT_OTHER)
Admission: RE | Admit: 2017-10-22 | Discharge: 2017-10-22 | Disposition: A | Discharge status: Home / Self Care | Payer: Medicaid Other | Source: Ambulatory Visit | Attending: Orthopedic Surgery | Admitting: Orthopedic Surgery

## 2017-10-22 ENCOUNTER — Encounter (HOSPITAL_BASED_OUTPATIENT_CLINIC_OR_DEPARTMENT_OTHER)
Admission: RE | Disposition: A | Discharge status: Home / Self Care | Payer: Self-pay | Source: Ambulatory Visit | Attending: Orthopedic Surgery

## 2017-10-22 DIAGNOSIS — Z885 Allergy status to narcotic agent status: Secondary | ICD-10-CM | POA: Diagnosis not present

## 2017-10-22 DIAGNOSIS — Z882 Allergy status to sulfonamides status: Secondary | ICD-10-CM | POA: Insufficient documentation

## 2017-10-22 DIAGNOSIS — Z87891 Personal history of nicotine dependence: Secondary | ICD-10-CM | POA: Diagnosis not present

## 2017-10-22 DIAGNOSIS — J45909 Unspecified asthma, uncomplicated: Secondary | ICD-10-CM | POA: Diagnosis not present

## 2017-10-22 DIAGNOSIS — M67431 Ganglion, right wrist: Secondary | ICD-10-CM | POA: Diagnosis present

## 2017-10-22 HISTORY — DX: Personal history of urinary calculi: Z87.442

## 2017-10-22 HISTORY — PX: MASS EXCISION: SHX2000

## 2017-10-22 SURGERY — EXCISION MASS
Anesthesia: General | Site: Wrist | Laterality: Right

## 2017-10-22 MED ORDER — DEXAMETHASONE SODIUM PHOSPHATE 10 MG/ML IJ SOLN
INTRAMUSCULAR | Status: DC | PRN
  Administered 2017-10-22: 10 mg via INTRAVENOUS

## 2017-10-22 MED ORDER — OXYCODONE HCL 5 MG PO TABS
5.0000 mg | ORAL_TABLET | Freq: Once | ORAL | Status: AC | PRN
  Administered 2017-10-22: 5 mg via ORAL

## 2017-10-22 MED ORDER — BUPIVACAINE HCL (PF) 0.25 % IJ SOLN
INTRAMUSCULAR | Status: AC
  Filled 2017-10-22: qty 120

## 2017-10-22 MED ORDER — MIDAZOLAM HCL 2 MG/2ML IJ SOLN
1.0000 mg | INTRAMUSCULAR | Status: DC | PRN
  Administered 2017-10-22: 2 mg via INTRAVENOUS

## 2017-10-22 MED ORDER — CEFAZOLIN SODIUM-DEXTROSE 2-3 GM-%(50ML) IV SOLR
INTRAVENOUS | Status: DC | PRN
  Administered 2017-10-22: 2 g via INTRAVENOUS

## 2017-10-22 MED ORDER — HYDROCODONE-ACETAMINOPHEN 5-325 MG PO TABS: ORAL_TABLET | ORAL | 0 refills | Status: DC

## 2017-10-22 MED ORDER — MIDAZOLAM HCL 2 MG/2ML IJ SOLN
INTRAMUSCULAR | Status: AC
  Filled 2017-10-22: qty 2

## 2017-10-22 MED ORDER — MEPERIDINE HCL 25 MG/ML IJ SOLN: 6.2500 mg | INTRAMUSCULAR | Status: DC | PRN

## 2017-10-22 MED ORDER — FENTANYL CITRATE (PF) 100 MCG/2ML IJ SOLN
INTRAMUSCULAR | Status: AC
  Filled 2017-10-22: qty 2

## 2017-10-22 MED ORDER — BUPIVACAINE HCL (PF) 0.25 % IJ SOLN
INTRAMUSCULAR | Status: DC | PRN
  Administered 2017-10-22: 6 mL

## 2017-10-22 MED ORDER — LACTATED RINGERS IV SOLN
INTRAVENOUS | Status: DC
  Administered 2017-10-22 (×3): via INTRAVENOUS

## 2017-10-22 MED ORDER — SCOPOLAMINE 1 MG/3DAYS TD PT72: 1.0000 | MEDICATED_PATCH | Freq: Once | TRANSDERMAL | Status: DC | PRN

## 2017-10-22 MED ORDER — FENTANYL CITRATE (PF) 100 MCG/2ML IJ SOLN
50.0000 ug | INTRAMUSCULAR | Status: DC | PRN
  Administered 2017-10-22: 50 ug via INTRAVENOUS
  Administered 2017-10-22: 100 ug via INTRAVENOUS

## 2017-10-22 MED ORDER — LIDOCAINE HCL (CARDIAC) 20 MG/ML IV SOLN
INTRAVENOUS | Status: DC | PRN
  Administered 2017-10-22: 100 mg via INTRAVENOUS

## 2017-10-22 MED ORDER — FENTANYL CITRATE (PF) 100 MCG/2ML IJ SOLN
25.0000 ug | INTRAMUSCULAR | Status: DC | PRN
  Administered 2017-10-22: 50 ug via INTRAVENOUS

## 2017-10-22 MED ORDER — PROMETHAZINE HCL 25 MG/ML IJ SOLN: 6.2500 mg | INTRAMUSCULAR | Status: DC | PRN

## 2017-10-22 MED ORDER — PROPOFOL 10 MG/ML IV BOLUS
INTRAVENOUS | Status: DC | PRN
  Administered 2017-10-22: 200 mg via INTRAVENOUS

## 2017-10-22 MED ORDER — OXYCODONE HCL 5 MG/5ML PO SOLN: 5.0000 mg | Freq: Once | ORAL | Status: AC | PRN

## 2017-10-22 MED ORDER — PROPOFOL 500 MG/50ML IV EMUL
INTRAVENOUS | Status: AC
  Filled 2017-10-22: qty 50

## 2017-10-22 MED ORDER — OXYCODONE HCL 5 MG PO TABS
ORAL_TABLET | ORAL | Status: AC
  Filled 2017-10-22: qty 1

## 2017-10-22 MED ORDER — CEFAZOLIN SODIUM-DEXTROSE 2-4 GM/100ML-% IV SOLN
INTRAVENOUS | Status: AC
  Filled 2017-10-22: qty 100

## 2017-10-22 SURGICAL SUPPLY — 58 items
APL SKNCLS STERI-STRIP NONHPOA (GAUZE/BANDAGES/DRESSINGS)
BANDAGE ACE 3X5.8 VEL STRL LF (GAUZE/BANDAGES/DRESSINGS) IMPLANT
BANDAGE COBAN STERILE 2 (GAUZE/BANDAGES/DRESSINGS) IMPLANT
BENZOIN TINCTURE PRP APPL 2/3 (GAUZE/BANDAGES/DRESSINGS) IMPLANT
BLADE MINI RND TIP GREEN BEAV (BLADE) IMPLANT
BLADE SURG 15 STRL LF DISP TIS (BLADE) ×2 IMPLANT
BLADE SURG 15 STRL SS (BLADE) ×4
BNDG CMPR 9X4 STRL LF SNTH (GAUZE/BANDAGES/DRESSINGS) ×1
BNDG COHESIVE 1X5 TAN STRL LF (GAUZE/BANDAGES/DRESSINGS) IMPLANT
BNDG CONFORM 2 STRL LF (GAUZE/BANDAGES/DRESSINGS) IMPLANT
BNDG ELASTIC 2X5.8 VLCR STR LF (GAUZE/BANDAGES/DRESSINGS) IMPLANT
BNDG ESMARK 4X9 LF (GAUZE/BANDAGES/DRESSINGS) ×1 IMPLANT
BNDG GAUZE 1X2.1 STRL (MISCELLANEOUS) IMPLANT
BNDG GAUZE ELAST 4 BULKY (GAUZE/BANDAGES/DRESSINGS) IMPLANT
BNDG PLASTER X FAST 3X3 WHT LF (CAST SUPPLIES) IMPLANT
BNDG PLSTR 9X3 FST ST WHT (CAST SUPPLIES)
CHLORAPREP W/TINT 26ML (MISCELLANEOUS) ×2 IMPLANT
CORD BIPOLAR FORCEPS 12FT (ELECTRODE) ×2 IMPLANT
COVER BACK TABLE 60X90IN (DRAPES) ×2 IMPLANT
COVER MAYO STAND STRL (DRAPES) ×2 IMPLANT
CUFF TOURNIQUET SINGLE 18IN (TOURNIQUET CUFF) ×2 IMPLANT
DRAPE EXTREMITY T 121X128X90 (DRAPE) ×2 IMPLANT
DRAPE SURG 17X23 STRL (DRAPES) ×2 IMPLANT
GAUZE SPONGE 4X4 12PLY STRL (GAUZE/BANDAGES/DRESSINGS) ×2 IMPLANT
GAUZE XEROFORM 1X8 LF (GAUZE/BANDAGES/DRESSINGS) ×2 IMPLANT
GLOVE BIO SURGEON STRL SZ7.5 (GLOVE) ×2 IMPLANT
GLOVE BIOGEL PI IND STRL 7.0 (GLOVE) ×1 IMPLANT
GLOVE BIOGEL PI IND STRL 8 (GLOVE) ×1 IMPLANT
GLOVE BIOGEL PI IND STRL 8.5 (GLOVE) IMPLANT
GLOVE BIOGEL PI INDICATOR 7.0 (GLOVE) ×1
GLOVE BIOGEL PI INDICATOR 8 (GLOVE) ×1
GLOVE BIOGEL PI INDICATOR 8.5 (GLOVE) ×1
GLOVE ECLIPSE 6.5 STRL STRAW (GLOVE) ×1 IMPLANT
GLOVE EXAM NITRILE MD LF STRL (GLOVE) ×2 IMPLANT
GLOVE SURG ORTHO 8.0 STRL STRW (GLOVE) ×1 IMPLANT
GOWN STRL REUS W/ TWL LRG LVL3 (GOWN DISPOSABLE) ×1 IMPLANT
GOWN STRL REUS W/TWL LRG LVL3 (GOWN DISPOSABLE) ×2
GOWN STRL REUS W/TWL XL LVL3 (GOWN DISPOSABLE) ×3 IMPLANT
NEEDLE HYPO 25X1 1.5 SAFETY (NEEDLE) ×2 IMPLANT
NS IRRIG 1000ML POUR BTL (IV SOLUTION) ×2 IMPLANT
PACK BASIN DAY SURGERY FS (CUSTOM PROCEDURE TRAY) ×2 IMPLANT
PAD CAST 3X4 CTTN HI CHSV (CAST SUPPLIES) IMPLANT
PAD CAST 4YDX4 CTTN HI CHSV (CAST SUPPLIES) IMPLANT
PADDING CAST ABS 4INX4YD NS (CAST SUPPLIES)
PADDING CAST ABS COTTON 4X4 ST (CAST SUPPLIES) ×1 IMPLANT
PADDING CAST COTTON 3X4 STRL (CAST SUPPLIES)
PADDING CAST COTTON 4X4 STRL (CAST SUPPLIES)
STOCKINETTE 4X48 STRL (DRAPES) ×2 IMPLANT
STRIP CLOSURE SKIN 1/2X4 (GAUZE/BANDAGES/DRESSINGS) IMPLANT
SUT ETHILON 3 0 PS 1 (SUTURE) IMPLANT
SUT ETHILON 4 0 PS 2 18 (SUTURE) ×2 IMPLANT
SUT ETHILON 5 0 P 3 18 (SUTURE)
SUT NYLON ETHILON 5-0 P-3 1X18 (SUTURE) IMPLANT
SUT VIC AB 4-0 P2 18 (SUTURE) ×1 IMPLANT
SYR BULB 3OZ (MISCELLANEOUS) ×2 IMPLANT
SYR CONTROL 10ML LL (SYRINGE) ×2 IMPLANT
TOWEL OR 17X24 6PK STRL BLUE (TOWEL DISPOSABLE) ×4 IMPLANT
UNDERPAD 30X30 (UNDERPADS AND DIAPERS) ×1 IMPLANT

## 2017-10-22 NOTE — Anesthesia Procedure Notes (Signed)
Procedure Name: LMA Insertion Date/Time: 10/22/2017 9:56 AM Performed by: Burna Cashonrad, Makenah Karas C, CRNA Pre-anesthesia Checklist: Patient identified, Emergency Drugs available, Suction available and Patient being monitored Patient Re-evaluated:Patient Re-evaluated prior to induction Oxygen Delivery Method: Circle system utilized Preoxygenation: Pre-oxygenation with 100% oxygen Induction Type: IV induction Ventilation: Mask ventilation without difficulty LMA: LMA inserted LMA Size: 4.0 Number of attempts: 1 Airway Equipment and Method: Bite block Placement Confirmation: positive ETCO2 Tube secured with: Tape Dental Injury: Teeth and Oropharynx as per pre-operative assessment

## 2017-10-22 NOTE — Op Note (Signed)
[id number] 

## 2017-10-22 NOTE — Op Note (Signed)
I assisted Surgeon(s) and Role:    * Betha LoaKuzma, Kevin, MD - Primary    * Cindee SaltKuzma, Najae Filsaime, MD - Assisting on the Procedure(s): EXCISION GANGLION RIGHT WRIST on 10/22/2017.  I provided assistance on this case as follows: setup,approach, identification of the cyst, retraction ,isolation and excision of the cyst, repair of the capsule, closure and application of the dressings.  Electronically signed by: Nicki ReaperKUZMA,Miel Wisener R, MD Date: 10/22/2017 Time: 10:43 AM

## 2017-10-22 NOTE — Anesthesia Preprocedure Evaluation (Addendum)
Anesthesia Evaluation  Patient identified by MRN, date of birth, ID band Patient awake    Reviewed: Allergy & Precautions, NPO status , Patient's Chart, lab work & pertinent test results  Airway Mallampati: II  TM Distance: >3 FB Neck ROM: Full    Dental no notable dental hx.    Pulmonary asthma , former smoker,    Pulmonary exam normal breath sounds clear to auscultation       Cardiovascular negative cardio ROS Normal cardiovascular exam Rhythm:Regular Rate:Normal     Neuro/Psych negative neurological ROS  negative psych ROS   GI/Hepatic negative GI ROS, Neg liver ROS,   Endo/Other  negative endocrine ROS  Renal/GU negative Renal ROS     Musculoskeletal negative musculoskeletal ROS (+)   Abdominal   Peds  Hematology negative hematology ROS (+)   Anesthesia Other Findings   Reproductive/Obstetrics negative OB ROS                             Anesthesia Physical Anesthesia Plan  ASA: II  Anesthesia Plan: General   Post-op Pain Management:    Induction: Intravenous  PONV Risk Score and Plan: 2 and Ondansetron, Propofol infusion and Midazolam  Airway Management Planned: LMA  Additional Equipment:   Intra-op Plan:   Post-operative Plan: Extubation in OR  Informed Consent: I have reviewed the patients History and Physical, chart, labs and discussed the procedure including the risks, benefits and alternatives for the proposed anesthesia with the patient or authorized representative who has indicated his/her understanding and acceptance.   Dental advisory given  Plan Discussed with: CRNA  Anesthesia Plan Comments:        Anesthesia Quick Evaluation

## 2017-10-22 NOTE — Discharge Instructions (Addendum)
°  Oxycodone 5 mg PO given at 12:06pm         [facility] Instructions Hand Surgery  Wound Care: Keep your hand elevated above the level of your heart.  Do not allow it to dangle by your side.  Keep the dressing dry and do not remove it unless your doctor advises you to do so.  He will usually change it at the time of your post-op visit.  Moving your fingers is advised to stimulate circulation but will depend on the site of your surgery.  If you have a splint applied, your doctor will advise you regarding movement.  Activity: Do not drive or operate machinery today.  Rest today and then you may return to your normal activity and work as indicated by your physician.  Diet:  Drink liquids today or eat a light diet.  You may resume a regular diet tomorrow.    General expectations: Pain for two to three days. Fingers may become slightly swollen.  Call your doctor if any of the following occur: Severe pain not relieved by pain medication. Elevated temperature. Dressing soaked with blood. Inability to move fingers. White or bluish color to fingers.      Post Anesthesia Home Care Instructions  Activity: Get plenty of rest for the remainder of the day. A responsible individual must stay with you for 24 hours following the procedure.  For the next 24 hours, DO NOT: -Drive a car -Advertising copywriterperate machinery -Drink alcoholic beverages -Take any medication unless instructed by your physician -Make any legal decisions or sign important papers.  Meals: Start with liquid foods such as gelatin or soup. Progress to regular foods as tolerated. Avoid greasy, spicy, heavy foods. If nausea and/or vomiting occur, drink only clear liquids until the nausea and/or vomiting subsides. Call your physician if vomiting continues.  Special Instructions/Symptoms: Your throat may feel dry or sore from the anesthesia or the breathing tube placed in your throat during surgery. If this causes discomfort, gargle  with warm salt water. The discomfort should disappear within 24 hours.  If you had a scopolamine patch placed behind your ear for the management of post- operative nausea and/or vomiting:  1. The medication in the patch is effective for 72 hours, after which it should be removed.  Wrap patch in a tissue and discard in the trash. Wash hands thoroughly with soap and water. 2. You may remove the patch earlier than 72 hours if you experience unpleasant side effects which may include dry mouth, dizziness or visual disturbances. 3. Avoid touching the patch. Wash your hands with soap and water after contact with the patch.

## 2017-10-22 NOTE — Anesthesia Postprocedure Evaluation (Signed)
Anesthesia Post Note  Patient: Medical illustratorCrashanda Edwards  Procedure(s) Performed: EXCISION GANGLION RIGHT WRIST (Right Wrist)     Patient location during evaluation: PACU Anesthesia Type: General Level of consciousness: sedated and patient cooperative Pain management: pain level controlled Vital Signs Assessment: post-procedure vital signs reviewed and stable Respiratory status: spontaneous breathing Cardiovascular status: stable Anesthetic complications: no    Last Vitals:  Vitals:   10/22/17 1145 10/22/17 1230  BP:  102/63  Pulse: 81 72  Resp: 17 18  Temp:  36.7 C  SpO2: 96% 98%    Last Pain:  Vitals:   10/22/17 1215  TempSrc:   PainSc: 3                  Lewie LoronJohn Zi Newbury

## 2017-10-22 NOTE — Brief Op Note (Signed)
10/22/2017  10:42 AM  PATIENT:  Kristi Edwards  37 y.o. female  PRE-OPERATIVE DIAGNOSIS:  RIGHT WRIST GANGLION  POST-OPERATIVE DIAGNOSIS:  RIGHT WRIST GANGLION  PROCEDURE:  Procedure(s): EXCISION GANGLION RIGHT WRIST (Right)  SURGEON:  Surgeon(s) and Role:    * Betha LoaKuzma, Melat Wrisley, MD - Primary    * Cindee SaltKuzma, Gary, MD - Assisting  PHYSICIAN ASSISTANT:   ASSISTANTS: Cindee SaltGary Yehia Mcbain, MD   ANESTHESIA:   general  EBL:  Minimal   BLOOD ADMINISTERED:none  DRAINS: none   LOCAL MEDICATIONS USED:  MARCAINE     SPECIMEN:  Source of Specimen:  right wrist  DISPOSITION OF SPECIMEN:  PATHOLOGY  COUNTS:  YES  TOURNIQUET:   Total Tourniquet Time Documented: Upper Arm (Right) - 32 minutes Total: Upper Arm (Right) - 32 minutes   DICTATION: .Other Dictation: Dictation Number [id number]  PLAN OF CARE: Discharge to home after PACU  PATIENT DISPOSITION:  PACU - hemodynamically stable.

## 2017-10-22 NOTE — H&P (Signed)
  Kristi Edwards is an 37 y.o. female.   Chief Complaint: right wrist ganglion HPI: 37 yo female with right wrist mass.  It is bothersome to her.  She wishes to have it removed.  Allergies:  Allergies  Allergen Reactions  . Dilaudid [Hydromorphone Hcl] Shortness Of Breath  . Sulfa Antibiotics Hives    Past Medical History:  Diagnosis Date  . Asthma   . History of kidney stones     Past Surgical History:  Procedure Laterality Date  . CHOLECYSTECTOMY     Sept 2018  . Cyst removed from right and left ear    . LITHOTRIPSY     X 2  . TONSILLECTOMY    . WISDOM TOOTH EXTRACTION      Family History: History reviewed. No pertinent family history.  Social History:   reports that she has quit smoking. she has never used smokeless tobacco. She reports that she drinks alcohol. She reports that she does not use drugs.  Medications: Medications Prior to Admission  Medication Sig Dispense Refill  . ibuprofen (ADVIL,MOTRIN) 200 MG tablet Take 200 mg by mouth every 6 (six) hours as needed.      No results found for this or any previous visit (from the past 48 hour(s)).  No results found.   A comprehensive review of systems was negative.  Blood pressure (!) 105/54, pulse 65, temperature 98.4 F (36.9 C), temperature source Oral, resp. rate 18, height 5\' 6"  (1.676 m), weight 79.8 kg (176 lb), last menstrual period 10/08/2017, SpO2 100 %.  General appearance: alert, cooperative and appears stated age Head: Normocephalic, without obvious abnormality, atraumatic Neck: supple, symmetrical, trachea midline Resp: clear to auscultation bilaterally Cardio: regular rate and rhythm GI: non-tender Extremities: Intact sensation and capillary refill all digits.  +epl/fpl/io.  No wounds.  Pulses: 2+ and symmetric Skin: Skin color, texture, turgor normal. No rashes or lesions Neurologic: Grossly normal Incision/Wound:none  Assessment/Plan Right wrist ganglion both volar and dorsal.   Non operative and operative treatment options were discussed with the patient and patient wishes to proceed with operative treatment. Risks, benefits, and alternatives of surgery were discussed and the patient agrees with the plan of care.   Emerald Shor R 10/22/2017, 9:17 AM

## 2017-10-22 NOTE — Transfer of Care (Signed)
Immediate Anesthesia Transfer of Care Note  Patient: Kristi Edwards  Procedure(s) Performed: EXCISION GANGLION RIGHT WRIST (Right Wrist)  Patient Location: PACU  Anesthesia Type:General  Level of Consciousness: sedated  Airway & Oxygen Therapy: Patient Spontanous Breathing and Patient connected to face mask oxygen  Post-op Assessment: Report given to RN and Post -op Vital signs reviewed and stable  Post vital signs: Reviewed and stable  Last Vitals:  Vitals:   10/22/17 0830  BP: (!) 105/54  Pulse: 65  Resp: 18  Temp: 36.9 C  SpO2: 100%    Last Pain:  Vitals:   10/22/17 0830  TempSrc: Oral         Complications: No apparent anesthesia complications

## 2017-10-23 ENCOUNTER — Encounter (HOSPITAL_BASED_OUTPATIENT_CLINIC_OR_DEPARTMENT_OTHER): Payer: Self-pay | Admitting: Orthopedic Surgery

## 2017-10-26 NOTE — Op Note (Signed)
Kristi Edwards, Kristi Edwards NO.:  [id number]  MEDICAL RECORD NO.:  [id number]  LOCATION:                                 FACILITY:  PHYSICIAN:  Kristi Loa, MD             DATE OF BIRTH:  DATE OF PROCEDURE:  10/22/2017 DATE OF DISCHARGE:                              OPERATIVE REPORT   PREOPERATIVE DIAGNOSIS:  Right wrist ganglion cyst, both volar and dorsal.  POSTOPERATIVE DIAGNOSIS:  Right wrist ganglion cyst, both volar and dorsal.  PROCEDURE:  Right wrist excision of volar ganglion cyst.  SURGEON:  Kristi Loa, MD.  ASSISTANT:  Cindee Salt, MD.  ANESTHESIA:  General.  IV FLUIDS:  Per anesthesia flow sheet.  ESTIMATED BLOOD LOSS:  Minimal.  COMPLICATIONS:  None.  SPECIMENS:  Cyst to Pathology.  TOURNIQUET TIME:  32 minutes.  DISPOSITION:  Stable to PACU.  INDICATIONS:  Kristi Edwards is a 37 year old female who has noted a mass in her right wrist.  This is bothersome to her.  She wished to have it removed.  Risks, benefits, and alternatives of surgery were discussed including the risks of blood loss, infection, damage to nerves, vessels, tendons, ligaments, and bone, failure of surgery, need for additional surgery, complications with wound healing, continued pain, and recurrence of cyst.  She voiced understanding these risks and elected to proceed.  OPERATIVE COURSE:  After being identified preoperatively by myself, the patient and I agreed upon the procedure and site of procedure.  Surgical site was marked.  The risks, benefits, and alternatives of surgery were reviewed and she wished to proceed.  Surgical consent had been signed. She was given IV Ancef as preoperative antibiotic prophylaxis.  She was transferred to the operating room and placed on the operating room table in supine position with the right upper extremity on an arm board. General anesthesia was induced by anesthesiologist.  Right upper extremity was prepped and draped in normal  sterile orthopedic fashion. A surgical pause was performed between surgeons, Anesthesia, and operating room staff; and all were in agreement as to the patient procedure and site of procedure.  Tourniquet at the proximal aspect of the extremity was inflated to 250 mmHg after exsanguination of the limb with an Esmarch bandage.  A Brunner-type incision was made at the volar wrist over the mass.  This was carried into subcutaneous tissues by spreading technique.  The mass was easily identified.  It was filled with clear gelatinous fluid.  It was carefully freed from surrounding tissues.  It was difficult to identify distinct stalk.  The mass coursed dorsally along the radial artery underneath the first dorsal compartment tendons.  It was easily removed from this area, however.  The mass sent to Pathology for examination.  The area was examined.  There was a small rent in the deep tissues in the volar aspect and at the FCR tendon sheath.  Compression over the joint or distally over the FCR tendon sheath, however, did not produce any cyst fluid.  A 4-0 Vicryl suture was used to close the rent that was noted.  The wound was copiously irrigated with sterile saline and closed with 4-0 nylon in  a horizontal mattress fashion.  It was injected with 0.25% plain Marcaine in postoperative analgesia.  It was dressed with sterile Xeroform, 4 x 4's, and ABD and wrapped with Kerlix and Ace bandage.  Tourniquet was deflated at 32 minutes.  Fingertips were pink with brisk capillary refill after deflation of tourniquet.  The operative drapes were broken down and the patient was awakened from anesthesia safely.  She was transferred back to stretcher and taken to PACU in stable condition.  I will see her back in the office in 1 week for postoperative followup.  I will give her Norco 5/325 one to two p.o. q.6 hours p.r.n. pain, dispensed #20.  She states she has not had a problem with this before.     Kristi Edwards  Kristi Dingus, MD   ______________________________ Kristi Edwards Kristi Sturgeon, MD    KK/MEDQ  D:  10/22/2017  T:  10/22/2017  Job:  [id number]

## 2019-10-12 ENCOUNTER — Emergency Department (HOSPITAL_COMMUNITY)
Admission: EM | Admit: 2019-10-12 | Discharge: 2019-10-12 | Disposition: A | Discharge status: Home / Self Care | Payer: Self-pay | Attending: Emergency Medicine | Admitting: Emergency Medicine

## 2019-10-12 ENCOUNTER — Encounter (HOSPITAL_COMMUNITY): Payer: Self-pay | Admitting: Emergency Medicine

## 2019-10-12 ENCOUNTER — Other Ambulatory Visit: Payer: Self-pay

## 2019-10-12 DIAGNOSIS — N3000 Acute cystitis without hematuria: Secondary | ICD-10-CM | POA: Insufficient documentation

## 2019-10-12 DIAGNOSIS — R5383 Other fatigue: Secondary | ICD-10-CM | POA: Insufficient documentation

## 2019-10-12 DIAGNOSIS — J45909 Unspecified asthma, uncomplicated: Secondary | ICD-10-CM | POA: Insufficient documentation

## 2019-10-12 DIAGNOSIS — R590 Localized enlarged lymph nodes: Secondary | ICD-10-CM | POA: Insufficient documentation

## 2019-10-12 DIAGNOSIS — Z87891 Personal history of nicotine dependence: Secondary | ICD-10-CM | POA: Insufficient documentation

## 2019-10-12 DIAGNOSIS — Z20822 Contact with and (suspected) exposure to covid-19: Secondary | ICD-10-CM | POA: Insufficient documentation

## 2019-10-12 LAB — URINALYSIS, ROUTINE W REFLEX MICROSCOPIC
Bilirubin Urine: NEGATIVE
Glucose, UA: NEGATIVE mg/dL
Ketones, ur: 20 mg/dL — AB
Nitrite: NEGATIVE
Protein, ur: NEGATIVE mg/dL
Specific Gravity, Urine: 1.026 (ref 1.005–1.030)
pH: 5 (ref 5.0–8.0)

## 2019-10-12 LAB — GROUP A STREP BY PCR: Group A Strep by PCR: NOT DETECTED

## 2019-10-12 LAB — PREGNANCY, URINE: Preg Test, Ur: NEGATIVE

## 2019-10-12 MED ORDER — CEPHALEXIN 500 MG PO CAPS: ORAL_CAPSULE | ORAL | 0 refills | Status: AC

## 2019-10-12 MED ORDER — CEPHALEXIN 500 MG PO CAPS
1000.0000 mg | ORAL_CAPSULE | Freq: Once | ORAL | Status: AC
  Administered 2019-10-12: 1000 mg via ORAL
  Filled 2019-10-12: qty 2

## 2019-10-12 NOTE — ED Provider Notes (Signed)
[facility] EMERGENCY DEPARTMENT Provider Note   CSN: [id number] Arrival date & time: 10/12/19  1253     History Chief Complaint  Patient presents with  . Weakness    Kristi Edwards is a 39 y.o. female who presents emergency department with chief complaint of fatigue and lymphadenopathy.  Patient noticed 2 swollen lymph nodes on the posterior right side of her neck.  She states that she has had a scratchy throat, body aches, mild headache and scratchy throat.  She denies cough, fever, chills.  She has had some frequent urination but denies flank pain, hematuria, urgency or dysuria. She denies scalp rash or pain, otalgia, dental pain, or sore throat.  HPI     Past Medical History:  Diagnosis Date  . Asthma   . History of kidney stones     There are no problems to display for this patient.   Past Surgical History:  Procedure Laterality Date  . CHOLECYSTECTOMY     Sept 2018  . Cyst removed from right and left ear    . LITHOTRIPSY     X 2  . MASS EXCISION Right 10/22/2017   Procedure: EXCISION GANGLION RIGHT WRIST;  Surgeon: Betha Loa, MD;  Location: [facility];  Service: Orthopedics;  Laterality: Right;  . TONSILLECTOMY    . WISDOM TOOTH EXTRACTION       OB History   No obstetric history on file.     No family history on file.  Social History   Tobacco Use  . Smoking status: Former Games developer  . Smokeless tobacco: Never Used  Substance Use Topics  . Alcohol use: Yes    Comment: occasionally  . Drug use: No    Home Medications Prior to Admission medications   Medication Sig Start Date End Date Taking? Authorizing Provider  ibuprofen (ADVIL,MOTRIN) 200 MG tablet Take 200 mg by mouth every 6 (six) hours as needed for mild pain or moderate pain.    Yes [provider]    Allergies    Dilaudid [hydromorphone hcl] and Sulfa antibiotics  Review of Systems   Review of Systems Ten systems reviewed and are negative for acute change,  except as noted in the HPI.   Physical Exam Updated Vital Signs BP 120/70 (BP Location: Right Arm)   Pulse 76   Temp 98.1 F (36.7 C) (Oral)   Resp 16   Ht 5\' 5"  (1.651 m)   Wt 83.9 kg   LMP 10/02/2019   SpO2 100%   BMI 30.79 kg/m   Physical Exam Vitals and nursing note reviewed.  Constitutional:      General: She is not in acute distress.    Appearance: She is well-developed. She is not diaphoretic.  HENT:     Head: Normocephalic and atraumatic.     Right Ear: Tympanic membrane normal.     Left Ear: Tympanic membrane normal.     Nose: No congestion.     Mouth/Throat:     Mouth: Mucous membranes are moist.     Pharynx: Posterior oropharyngeal erythema present. No oropharyngeal exudate.  Eyes:     General: No scleral icterus.    Conjunctiva/sclera: Conjunctivae normal.  Cardiovascular:     Rate and Rhythm: Normal rate and regular rhythm.     Heart sounds: Normal heart sounds. No murmur. No friction rub. No gallop.   Pulmonary:     Effort: Pulmonary effort is normal. No respiratory distress.     Breath sounds: Normal breath sounds.  Abdominal:     General: Bowel sounds are normal. There is no distension.     Palpations: Abdomen is soft. There is no mass.     Tenderness: There is no abdominal tenderness. There is no guarding.  Musculoskeletal:     Cervical back: Normal range of motion.  Lymphadenopathy:     Head:     Right side of head: No submental, submandibular, tonsillar, preauricular, posterior auricular or occipital adenopathy.     Left side of head: No submental, submandibular, tonsillar, preauricular, posterior auricular or occipital adenopathy.     Cervical: Cervical adenopathy present.     Right cervical: Posterior cervical adenopathy present. No superficial or deep cervical adenopathy.    Left cervical: No superficial or deep cervical adenopathy.     Upper Body:     Right upper body: No supraclavicular or axillary adenopathy.     Left upper body: No  supraclavicular or axillary adenopathy.  Skin:    General: Skin is warm and dry.  Neurological:     Mental Status: She is alert and oriented to person, place, and time.  Psychiatric:        Behavior: Behavior normal.     ED Results / Procedures / Treatments   Labs (all labs ordered are listed, but only abnormal results are displayed) Labs Reviewed - No data to display  EKG None  Radiology No results found.  Procedures Procedures (including critical care time)  Medications Ordered in ED Medications - No data to display  ED Course  I have reviewed the triage vital signs and the nursing notes.  Pertinent labs & imaging results that were available during my care of the patient were reviewed by me and considered in my medical decision making (see chart for details).    MDM Rules/Calculators/A&P                      Patient here with nonspecific viral-like symptoms.  She has lymph nodes which are swollen on the posterior cervical chain.  No evidence of scalp infection.  She has no other adenopathy and have no suspicion for lymphoma.  Her urine does appear infected and should be treated with Keflex.  Her strep test is negative.  Covid test is pending.  Patient appears appropriate for discharge at this time.  Discussed return precautions.  Kristi Edwards was evaluated in Emergency Department on 10/12/2019 for the symptoms described in the history of present illness. She was evaluated in the context of the global COVID-19 pandemic, which necessitated consideration that the patient might be at risk for infection with the SARS-CoV-2 virus that causes COVID-19. Institutional protocols and algorithms that pertain to the evaluation of patients at risk for COVID-19 are in a state of rapid change based on information released by regulatory bodies including the CDC and federal and state organizations. These policies and algorithms were followed during the patient's care in the ED.   Final  Clinical Impression(s) / ED Diagnoses Final diagnoses:  None    Rx / DC Orders ED Discharge Orders    None       Margarita Mail, PA-C 10/12/19 2154    Daleen Bo, MD 10/13/19 1702

## 2019-10-12 NOTE — Discharge Instructions (Signed)
You have a uti. I am sending you home with antibiotics. Please complete the full course. Please seek immediate care if you develop the following: There is back pain.  Your symptoms are no better or worse in 3 days. There is severe back pain or lower abdominal pain.  You develop chills.  You have a fever.  There is nausea or vomiting.  There is continued burning or discomfort with urination.

## 2019-10-12 NOTE — ED Triage Notes (Signed)
Pt states that she has two swollen lymph nodes on the back of her neck she is tried and run down she also has a starchy thaort

## 2019-10-13 LAB — SARS CORONAVIRUS 2 (TAT 6-24 HRS): SARS Coronavirus 2: NEGATIVE

## 2019-12-09 ENCOUNTER — Ambulatory Visit: Payer: Medicaid Other | Attending: Internal Medicine

## 2019-12-09 ENCOUNTER — Other Ambulatory Visit: Payer: Self-pay

## 2019-12-09 DIAGNOSIS — Z20822 Contact with and (suspected) exposure to covid-19: Secondary | ICD-10-CM

## 2019-12-10 LAB — NOVEL CORONAVIRUS, NAA: SARS-CoV-2, NAA: NOT DETECTED

## 2019-12-17 ENCOUNTER — Ambulatory Visit: Payer: Medicaid Other | Attending: Internal Medicine

## 2019-12-17 DIAGNOSIS — Z23 Encounter for immunization: Secondary | ICD-10-CM

## 2019-12-17 NOTE — Progress Notes (Signed)
   Covid-19 Vaccination Clinic  Name:  Kristi Edwards    MRN: [id number] DOB: 06-May-1981  12/17/2019  Kristi Edwards was observed post Covid-19 immunization for 15 minutes without incident. She was provided with Vaccine Information Sheet and instruction to access the V-Safe system.   Kristi Edwards was instructed to call 911 with any severe reactions post vaccine: Marland Kitchen Difficulty breathing  . Swelling of face and throat  . A fast heartbeat  . A bad rash all over body  . Dizziness and weakness   Immunizations Administered    Name Date Dose VIS Date Route   Pfizer COVID-19 Vaccine 12/17/2019 12:34 PM 0.3 mL 09/16/2019 Intramuscular   Manufacturer: [employer], [employer]   Lot: [id number]   NDC: [address]-8

## 2019-12-31 ENCOUNTER — Ambulatory Visit: Payer: Self-pay

## 2020-01-10 ENCOUNTER — Ambulatory Visit: Payer: Self-pay

## 2020-01-11 ENCOUNTER — Ambulatory Visit: Payer: Medicaid Other | Attending: Internal Medicine

## 2020-01-11 DIAGNOSIS — Z23 Encounter for immunization: Secondary | ICD-10-CM

## 2020-01-11 NOTE — Progress Notes (Signed)
   Covid-19 Vaccination Clinic  Name:  Kristi Edwards    MRN: [id number] DOB: [birth year]  01/11/2020  Ms. [name] was observed post Covid-19 immunization for 15 minutes without incident. She was provided with Vaccine Information Sheet and instruction to access the V-Safe system.   Ms. [name] was instructed to call 911 with any severe reactions post vaccine: Marland Kitchen Difficulty breathing  . Swelling of face and throat  . A fast heartbeat  . A bad rash all over body  . Dizziness and weakness   Immunizations Administered    Name Date Dose VIS Date Route   Pfizer COVID-19 Vaccine 01/11/2020  3:11 PM 0.3 mL 09/16/2019 Intramuscular   Manufacturer: [employer], [employer]   Lot: [id number]   NDC: [address]-6

## 2020-06-05 ENCOUNTER — Other Ambulatory Visit: Payer: Self-pay | Admitting: Physician Assistant

## 2020-06-05 DIAGNOSIS — R202 Paresthesia of skin: Secondary | ICD-10-CM

## 2020-06-05 DIAGNOSIS — R519 Headache, unspecified: Secondary | ICD-10-CM

## 2020-06-05 DIAGNOSIS — R413 Other amnesia: Secondary | ICD-10-CM

## 2020-06-30 ENCOUNTER — Ambulatory Visit
Admission: RE | Admit: 2020-06-30 | Discharge: 2020-06-30 | Disposition: A | Discharge status: Home / Self Care | Payer: Medicaid Other | Source: Ambulatory Visit | Attending: Physician Assistant | Admitting: Physician Assistant

## 2020-06-30 DIAGNOSIS — R519 Headache, unspecified: Secondary | ICD-10-CM

## 2020-06-30 DIAGNOSIS — R202 Paresthesia of skin: Secondary | ICD-10-CM

## 2020-06-30 DIAGNOSIS — R413 Other amnesia: Secondary | ICD-10-CM

## 2020-06-30 MED ORDER — GADOBENATE DIMEGLUMINE 529 MG/ML IV SOLN
17.0000 mL | Freq: Once | INTRAVENOUS | Status: AC | PRN
  Administered 2020-06-30: 17 mL via INTRAVENOUS

## 2020-07-03 ENCOUNTER — Encounter: Payer: Self-pay | Admitting: Neurology

## 2020-09-11 ENCOUNTER — Ambulatory Visit: Payer: No Typology Code available for payment source | Admitting: Neurology

## 2020-10-22 ENCOUNTER — Ambulatory Visit: Payer: Self-pay
